# Patient Record
Sex: Female | Born: 1980 | Race: Black or African American | Hispanic: No | Marital: Single | Smoking: Former smoker
Health system: Southern US, Community
[De-identification: ages and names within clinical notes are randomized; demographics above are authoritative.]

## PROBLEM LIST (undated history)

## (undated) DIAGNOSIS — D649 Anemia, unspecified: Secondary | ICD-10-CM

## (undated) DIAGNOSIS — K219 Gastro-esophageal reflux disease without esophagitis: Secondary | ICD-10-CM

## (undated) DIAGNOSIS — E669 Obesity, unspecified: Secondary | ICD-10-CM

## (undated) DIAGNOSIS — IMO0002 Reserved for concepts with insufficient information to code with codable children: Secondary | ICD-10-CM

## (undated) DIAGNOSIS — R87619 Unspecified abnormal cytological findings in specimens from cervix uteri: Secondary | ICD-10-CM

## (undated) HISTORY — DX: Unspecified abnormal cytological findings in specimens from cervix uteri: R87.619

## (undated) HISTORY — DX: Obesity, unspecified: E66.9

## (undated) HISTORY — DX: Reserved for concepts with insufficient information to code with codable children: IMO0002

---

## 2010-04-16 HISTORY — PX: CRYOTHERAPY: SHX1416

## 2011-05-03 ENCOUNTER — Other Ambulatory Visit: Payer: Self-pay

## 2011-05-03 ENCOUNTER — Encounter (HOSPITAL_BASED_OUTPATIENT_CLINIC_OR_DEPARTMENT_OTHER): Payer: Self-pay | Admitting: *Deleted

## 2011-05-03 ENCOUNTER — Emergency Department (INDEPENDENT_AMBULATORY_CARE_PROVIDER_SITE_OTHER): Payer: BC Managed Care – PPO

## 2011-05-03 ENCOUNTER — Emergency Department (HOSPITAL_BASED_OUTPATIENT_CLINIC_OR_DEPARTMENT_OTHER)
Admission: EM | Admit: 2011-05-03 | Discharge: 2011-05-03 | Disposition: A | Discharge status: Home / Self Care | Payer: BC Managed Care – PPO | Attending: Emergency Medicine | Admitting: Emergency Medicine

## 2011-05-03 DIAGNOSIS — R0602 Shortness of breath: Secondary | ICD-10-CM

## 2011-05-03 DIAGNOSIS — K219 Gastro-esophageal reflux disease without esophagitis: Secondary | ICD-10-CM | POA: Insufficient documentation

## 2011-05-03 DIAGNOSIS — M549 Dorsalgia, unspecified: Secondary | ICD-10-CM | POA: Insufficient documentation

## 2011-05-03 DIAGNOSIS — R079 Chest pain, unspecified: Secondary | ICD-10-CM

## 2011-05-03 DIAGNOSIS — F17200 Nicotine dependence, unspecified, uncomplicated: Secondary | ICD-10-CM

## 2011-05-03 HISTORY — DX: Gastro-esophageal reflux disease without esophagitis: K21.9

## 2011-05-03 LAB — URINALYSIS, ROUTINE W REFLEX MICROSCOPIC
Glucose, UA: NEGATIVE mg/dL
Ketones, ur: NEGATIVE mg/dL
Protein, ur: 30 mg/dL — AB
pH: 6.5 (ref 5.0–8.0)

## 2011-05-03 LAB — URINE MICROSCOPIC-ADD ON

## 2011-05-03 LAB — PREGNANCY, URINE: Preg Test, Ur: NEGATIVE

## 2011-05-03 MED ORDER — DIAZEPAM 5 MG PO TABS: 5.0000 mg | ORAL_TABLET | Freq: Every day | ORAL | Status: AC

## 2011-05-03 MED ORDER — IBUPROFEN 800 MG PO TABS: 800.0000 mg | ORAL_TABLET | Freq: Three times a day (TID) | ORAL | Status: AC

## 2011-05-03 MED ORDER — ACETAMINOPHEN 500 MG PO TABS
1000.0000 mg | ORAL_TABLET | Freq: Once | ORAL | Status: AC
  Administered 2011-05-03: 1000 mg via ORAL
  Filled 2011-05-03: qty 2

## 2011-05-03 NOTE — ED Notes (Signed)
Report received from Jodell Cipro, RN and care assumed.  Dr. Jeraldine Loots at bedside.

## 2011-05-03 NOTE — ED Notes (Signed)
C/o left sided back pain since last night, denies any urinary sxs. No known injury

## 2011-05-03 NOTE — ED Provider Notes (Signed)
History     CSN: [id number]  Arrival date & time 05/03/11  1191   First MD Initiated Contact with Patient 05/03/11 [phone]      Chief Complaint  Patient presents with  . Back Pain    (Consider location/radiation/quality/duration/timing/severity/associated sxs/prior treatment) HPI The patient presents with pain focally about her left superior medial scapula. She notes that the pain is a crampy/sharp sensation. Pain is nonradiating, began approximately 12 hours ago, gradually. Since onset the pain has been present constantly, though the patient was able to sleep. No relief with Aleve, pain is worse with positioning; bending over in particular. The patient's work requires standing for "11" hours daily, and she completed a full work day yesterday. No fevers, no chills, minimal nonspecific radiation. No nausea, no vomiting, no exertional or pleuritic pain, no dyspnea. Past Medical History  Diagnosis Date  . GERD (gastroesophageal reflux disease)     History reviewed. No pertinent past surgical history.  History reviewed. No pertinent family history.  History  Substance Use Topics  . Smoking status: Never Smoker   . Smokeless tobacco: Not on file  . Alcohol Use: No    OB History    Grav Para Term Preterm Abortions TAB SAB Ect Mult Living                  Review of Systems  Constitutional:       HPI  HENT:       HPI otherwise negative  Eyes: Negative.   Respiratory:       HPI, otherwise negative  Cardiovascular:       HPI, otherwise nmegative  Gastrointestinal: Negative for vomiting.  Genitourinary:       HPI, otherwise negative  Musculoskeletal:       HPI, otherwise negative  Skin: Negative.   Neurological: Negative for syncope.   LMP: currently menstruating.  Allergies  Review of patient's allergies indicates no known allergies.  Home Medications   Current Outpatient Rx  Name Route Sig Dispense Refill  . RANITIDINE HCL 150 MG PO TABS Oral Take 150 mg by  mouth 2 (two) times daily.      BP 123/70  Pulse 62  Temp(Src) 97.8 F (36.6 C) (Oral)  Resp 17  Ht 5\' 10"  (1.778 m)  Wt 230 lb (104.327 kg)  BMI 33.00 kg/m2  SpO2 100%  LMP 04/30/2011  Physical Exam  Nursing note and vitals reviewed. Constitutional: She is oriented to person, place, and time. She appears well-developed and well-nourished. No distress.  HENT:  Head: Normocephalic and atraumatic.  Eyes: Conjunctivae and EOM are normal.  Cardiovascular: Normal rate and regular rhythm.   Pulmonary/Chest: Effort normal and breath sounds normal. No stridor. No respiratory distress.  Abdominal: She exhibits no distension.  Musculoskeletal: She exhibits no edema.       Arms:      Strength of left shoulder is symmetric to right 5/5.  Neurological: She is alert and oriented to person, place, and time. No cranial nerve deficit.  Skin: Skin is warm and dry.  Psychiatric: She has a normal mood and affect.    ED Course  Procedures (including critical care time)  Labs Reviewed  URINALYSIS, ROUTINE W REFLEX MICROSCOPIC - Abnormal; Notable for the following:    APPearance CLOUDY (*)    Hgb urine dipstick LARGE (*)    Protein, ur 30 (*)    All other components within normal limits  URINE MICROSCOPIC-ADD ON - Abnormal; Notable for the following:  Squamous Epithelial / LPF MANY (*)    Bacteria, UA MANY (*)    All other components within normal limits  PREGNANCY, URINE   No results found.   No diagnosis found.   Date: 05/03/2011  Rate: 56  Rhythm: normal sinus rhythm  QRS Axis: normal  Intervals: normal  ST/T Wave abnormalities: normal  Conduction Disutrbances:none  Narrative Interpretation:   Old EKG Reviewed: none available NORMAL ECG  CXR: no acute findings REVIEWED BY ME   MDM  This generally well young female presents with left superior lateral back pain. On exam she is in no distress, has minimal appreciable pain with palpation, and no neuro deficits. The  patient's ECG E. chest x-ray are both reassuring. Given the absence of risk factors, her youth, with the aforementioned unremarkable findings there is very low suspicion for cardiac etiology, or occult infection. The patient's presentation is most consistent with musculoskeletal discomfort. (The patient is on her menstrual cycle, explaining the blood in her urine. Given the absence of flank pain, dysuria, there is low suspicion for kidney stone. The absence leukocytes or nitrates is reassuring for this not being a manifestation of UTI ). The patient will be discharged to follow up with her primary care physician.        Gerhard Munch, MD 05/03/11 [phone]

## 2011-05-03 NOTE — ED Notes (Addendum)
Charted on wrong pt.

## 2012-08-06 ENCOUNTER — Encounter: Payer: Self-pay | Admitting: Obstetrics & Gynecology

## 2012-08-06 ENCOUNTER — Ambulatory Visit (INDEPENDENT_AMBULATORY_CARE_PROVIDER_SITE_OTHER): Payer: No Typology Code available for payment source | Admitting: Obstetrics & Gynecology

## 2012-08-06 VITALS — BP 123/74 | HR 67 | Temp 98.1°F | Resp 16 | Ht 70.0 in | Wt 249.0 lb

## 2012-08-06 DIAGNOSIS — Z0000 Encounter for general adult medical examination without abnormal findings: Secondary | ICD-10-CM

## 2012-08-06 DIAGNOSIS — Z124 Encounter for screening for malignant neoplasm of cervix: Secondary | ICD-10-CM

## 2012-08-06 DIAGNOSIS — Z01419 Encounter for gynecological examination (general) (routine) without abnormal findings: Secondary | ICD-10-CM

## 2012-08-06 DIAGNOSIS — Z113 Encounter for screening for infections with a predominantly sexual mode of transmission: Secondary | ICD-10-CM

## 2012-08-06 DIAGNOSIS — Z1151 Encounter for screening for human papillomavirus (HPV): Secondary | ICD-10-CM

## 2012-08-06 LAB — CBC
MCH: 24.2 pg — ABNORMAL LOW (ref 26.0–34.0)
MCV: 76.3 fL — ABNORMAL LOW (ref 78.0–100.0)
Platelets: 508 10*3/uL — ABNORMAL HIGH (ref 150–400)
RBC: 4.59 MIL/uL (ref 3.87–5.11)
RDW: 16.2 % — ABNORMAL HIGH (ref 11.5–15.5)
WBC: 4.7 10*3/uL (ref 4.0–10.5)

## 2012-08-06 NOTE — Progress Notes (Signed)
Subjective:    Cathy Walls is a 32 y.o. female who presents for an annual exam. The patient has no complaints today. The patient is sexually active. GYN screening history: last pap: was abnormal: 2012, required cryo, no follow up. The patient wears seatbelts: yes. The patient participates in regular exercise: no. Has the patient ever been transfused or tattooed?: no. The patient reports that there is not domestic violence in her life.   Menstrual History: OB History   Grav Para Term Preterm Abortions TAB SAB Ect Mult Living   3 1 1  1  1   1       Menarche age: 41  Patient's last menstrual period was 07/08/2012.    The following portions of the patient's history were reviewed and updated as appropriate: allergies, current medications, past family history, past medical history, past social history, past surgical history and problem list.  Review of Systems A comprehensive review of systems was negative.  She is a Scientist, clinical (histocompatibility and immunogenetics). She has been monogamous for about a year and denies dyspareunia. She lives with her son. She usses withdrawal for birthcontrol and is considering an IUD.   Objective:    BP 123/74  Pulse 67  Temp(Src) 98.1 F (36.7 C) (Oral)  Resp 16  Ht 5\' 10"  (1.778 m)  Wt 249 lb (112.946 kg)  BMI 35.73 kg/m2  LMP 07/08/2012  General Appearance:    Alert, cooperative, no distress, appears stated age  Head:    Normocephalic, without obvious abnormality, atraumatic  Eyes:    PERRL, conjunctiva/corneas clear, EOM's intact, fundi    benign, both eyes  Ears:    Normal TM's and external ear canals, both ears  Nose:   Nares normal, septum midline, mucosa normal, no drainage    or sinus tenderness  Throat:   Lips, mucosa, and tongue normal; teeth and gums normal  Neck:   Supple, symmetrical, trachea midline, no adenopathy;    thyroid:  no enlargement/tenderness/nodules; no carotid   bruit or JVD  Back:     Symmetric, no curvature, ROM normal, no CVA tenderness  Lungs:      Clear to auscultation bilaterally, respirations unlabored  Chest Wall:    No tenderness or deformity   Heart:    Regular rate and rhythm, S1 and S2 normal, no murmur, rub   or gallop  Breast Exam:    No tenderness, masses, or nipple abnormality  Abdomen:     Soft, non-tender, bowel sounds active all four quadrants,    no masses, no organomegaly, obese  Genitalia:    Normal female without lesion, discharge or tenderness, thin white discharge, NSSA, NT, normal adnexal exam, acanthosis nigricans noted in groin     Extremities:   Extremities normal, atraumatic, no cyanosis or edema  Pulses:   2+ and symmetric all extremities  Skin:   Skin color, texture, turgor normal, no rashes or lesions  Lymph nodes:   Cervical, supraclavicular, and axillary nodes normal  Neurologic:   CNII-XII intact, normal strength, sensation and reflexes    throughout  .    Assessment:    Healthy female exam.  Vaginal discharge   Plan:     Thin prep Pap smear.  with HPV cotesting and cultures Screening fasting labs today Information given about IUDs.

## 2012-08-07 ENCOUNTER — Encounter: Payer: Self-pay | Admitting: Obstetrics & Gynecology

## 2012-08-07 ENCOUNTER — Telehealth: Payer: Self-pay | Admitting: *Deleted

## 2012-08-07 LAB — COMPREHENSIVE METABOLIC PANEL
ALT: 12 U/L (ref 0–35)
AST: 17 U/L (ref 0–37)
CO2: 29 mEq/L (ref 19–32)
Creat: 0.81 mg/dL (ref 0.50–1.10)
Sodium: 140 mEq/L (ref 135–145)
Total Bilirubin: 0.4 mg/dL (ref 0.3–1.2)
Total Protein: 7.2 g/dL (ref 6.0–8.3)

## 2012-08-07 LAB — LIPID PANEL
HDL: 42 mg/dL (ref >39)
LDL Cholesterol: 109 mg/dL — ABNORMAL HIGH (ref 0–99)
Total CHOL/HDL Ratio: 3.9 Ratio
VLDL: 14 mg/dL (ref 0–40)

## 2012-08-07 LAB — TSH: TSH: 1.941 u[IU]/mL (ref 0.350–4.500)

## 2012-08-07 NOTE — Telephone Encounter (Signed)
Copy of labs mailed to pt's home address. 

## 2012-08-11 ENCOUNTER — Telehealth: Payer: Self-pay | Admitting: *Deleted

## 2012-08-11 NOTE — Telephone Encounter (Signed)
Pt notified of lab results from 08/07/12

## 2012-08-12 ENCOUNTER — Telehealth: Payer: Self-pay | Admitting: *Deleted

## 2012-08-12 DIAGNOSIS — B9689 Other specified bacterial agents as the cause of diseases classified elsewhere: Secondary | ICD-10-CM

## 2012-08-12 MED ORDER — METRONIDAZOLE 500 MG PO TABS: 500.0000 mg | ORAL_TABLET | Freq: Two times a day (BID) | ORAL | Status: DC

## 2012-08-12 NOTE — Telephone Encounter (Signed)
Message copied by Granville Lewis on Tue Aug 12, 2012 11:53 AM ------      Message from: Allie Bossier      Created: Tue Aug 12, 2012 11:48 AM       She has BV and will need flagyl 500 mg TID for 5 days. No alcohol.      Thanks ------

## 2012-08-12 NOTE — Telephone Encounter (Signed)
Sent to [facility] in [place].  Pt is scheduled for Mirena insert tomorrow.

## 2012-08-13 ENCOUNTER — Ambulatory Visit: Payer: No Typology Code available for payment source | Admitting: Advanced Practice Midwife

## 2012-08-13 ENCOUNTER — Telehealth: Payer: Self-pay | Admitting: *Deleted

## 2012-08-13 NOTE — Telephone Encounter (Signed)
Pt notified of positive BV and RX has been sent to her pharmacy for Flagyl.  Her Mirena appt has been cancelled until after she finishes the Flagyl due to increased risk of PID.  She will call to reschedule appt.

## 2012-09-04 ENCOUNTER — Encounter: Payer: Self-pay | Admitting: Obstetrics & Gynecology

## 2012-09-04 ENCOUNTER — Ambulatory Visit: Payer: No Typology Code available for payment source | Admitting: Obstetrics & Gynecology

## 2012-09-04 ENCOUNTER — Ambulatory Visit (INDEPENDENT_AMBULATORY_CARE_PROVIDER_SITE_OTHER): Payer: No Typology Code available for payment source | Admitting: Obstetrics & Gynecology

## 2012-09-04 VITALS — BP 117/79 | HR 88 | Resp 16 | Ht 69.0 in | Wt 251.0 lb

## 2012-09-04 DIAGNOSIS — B9689 Other specified bacterial agents as the cause of diseases classified elsewhere: Secondary | ICD-10-CM

## 2012-09-04 DIAGNOSIS — A499 Bacterial infection, unspecified: Secondary | ICD-10-CM

## 2012-09-04 DIAGNOSIS — N760 Acute vaginitis: Secondary | ICD-10-CM

## 2012-09-04 MED ORDER — CLINDAMYCIN PHOSPHATE 1 % EX GEL: Freq: Two times a day (BID) | CUTANEOUS | Status: DC

## 2012-09-04 NOTE — Progress Notes (Signed)
  Subjective:    Patient ID: Cathy Walls, female    DOB: [birth year], 32 y.o.   MRN: [id number]  HPI  Cathy Walls is here because her BV is still present. She took 7 days of BID flagyl and still has symptoms.  Review of Systems     Objective:   Physical Exam  Same white frothy discharge that smells and looks like BV      Assessment & Plan:   BV- cleocin cream

## 2012-09-05 ENCOUNTER — Other Ambulatory Visit: Payer: Self-pay | Admitting: Obstetrics and Gynecology

## 2012-09-30 ENCOUNTER — Ambulatory Visit (INDEPENDENT_AMBULATORY_CARE_PROVIDER_SITE_OTHER): Payer: No Typology Code available for payment source | Admitting: Obstetrics & Gynecology

## 2012-09-30 ENCOUNTER — Encounter: Payer: Self-pay | Admitting: Obstetrics & Gynecology

## 2012-09-30 VITALS — BP 116/68 | HR 84 | Resp 16 | Ht 69.0 in | Wt 250.0 lb

## 2012-09-30 DIAGNOSIS — N949 Unspecified condition associated with female genital organs and menstrual cycle: Secondary | ICD-10-CM

## 2012-09-30 DIAGNOSIS — N898 Other specified noninflammatory disorders of vagina: Secondary | ICD-10-CM | POA: Insufficient documentation

## 2012-09-30 DIAGNOSIS — R300 Dysuria: Secondary | ICD-10-CM

## 2012-09-30 DIAGNOSIS — N9489 Other specified conditions associated with female genital organs and menstrual cycle: Secondary | ICD-10-CM

## 2012-09-30 LAB — POCT URINALYSIS DIPSTICK
Leukocytes, UA: NEGATIVE
Nitrite, UA: NEGATIVE
Protein, UA: 300
Urobilinogen, UA: 0.2
pH, UA: 6.5

## 2012-09-30 NOTE — Progress Notes (Signed)
  Subjective:    Patient ID: Cathy Walls, female    DOB: [birth year], 32 y.o.   MRN: [id number]  HPI  Pt diagnosed with BV in 4/14 (postive PCR).  Pt returned in 5/14 with more symptoms and clinical exam c/w BV.  Pt still has symptoms and burning with urination.  Pt c/o bump on left labia majora.  Denies new sex partner or pelvic pain.  No abnml bleeding.  Pt used Monistat recently w/o relief.  NO fishy odor.  Review of Systems    see HPI Objective:   Physical Exam  Vitals reviewed. Constitutional: She appears well-developed and well-nourished. No distress.  HENT:  Head: Normocephalic and atraumatic.  Eyes: Conjunctivae are normal.  Pulmonary/Chest: Effort normal.  Abdominal: Soft. She exhibits no distension. There is no tenderness.  Genitourinary: Vagina normal and uterus normal. No vaginal discharge found.  Skin: Skin is warm and dry.  Psychiatric: She has a normal mood and affect.          Assessment & Plan:  32 yo female with dysuria and vaginal d/c  1-UA, U cx 2-BD affirm and cultures. 3-No lesions on vulva 4-Will call with results.

## 2012-09-30 NOTE — Addendum Note (Signed)
Addended by: Arne Cleveland on: 09/30/2012 03:34 PM   Modules accepted: Orders

## 2012-10-01 ENCOUNTER — Telehealth: Payer: Self-pay | Admitting: *Deleted

## 2012-10-01 DIAGNOSIS — A599 Trichomoniasis, unspecified: Secondary | ICD-10-CM

## 2012-10-01 LAB — GC/CHLAMYDIA PROBE AMP
CT Probe RNA: NEGATIVE
GC Probe RNA: NEGATIVE

## 2012-10-01 LAB — WET PREP BY MOLECULAR PROBE: Candida species: NEGATIVE

## 2012-10-01 MED ORDER — METRONIDAZOLE 500 MG PO TABS: 500.0000 mg | ORAL_TABLET | Freq: Two times a day (BID) | ORAL | Status: DC

## 2012-10-01 NOTE — Telephone Encounter (Signed)
Pt notified of positive Trichomonas.  RX for Flagyl sent to [facility].  Pt aware that her partner needs RX also and should not have intercourse until both are treated.  Pt voices understanding.

## 2012-10-02 ENCOUNTER — Telehealth: Payer: Self-pay | Admitting: *Deleted

## 2012-10-02 DIAGNOSIS — A599 Trichomoniasis, unspecified: Secondary | ICD-10-CM

## 2012-10-02 MED ORDER — METRONIDAZOLE 500 MG PO TABS: ORAL_TABLET | ORAL | Status: DC

## 2012-10-02 NOTE — Telephone Encounter (Signed)
Pharmacy notified to change RX of Flagyl to 2 GM to be taken at once.  Pt aware.

## 2012-10-04 LAB — CULTURE, URINE COMPREHENSIVE

## 2012-10-08 ENCOUNTER — Encounter: Payer: Self-pay | Admitting: Obstetrics & Gynecology

## 2012-10-08 ENCOUNTER — Ambulatory Visit (INDEPENDENT_AMBULATORY_CARE_PROVIDER_SITE_OTHER): Payer: No Typology Code available for payment source | Admitting: Obstetrics & Gynecology

## 2012-10-08 VITALS — BP 130/85 | HR 93 | Resp 16 | Ht 69.0 in | Wt 251.0 lb

## 2012-10-08 DIAGNOSIS — N9489 Other specified conditions associated with female genital organs and menstrual cycle: Secondary | ICD-10-CM

## 2012-10-08 DIAGNOSIS — N949 Unspecified condition associated with female genital organs and menstrual cycle: Secondary | ICD-10-CM

## 2012-10-08 NOTE — Progress Notes (Signed)
  Subjective:    Patient ID: Cathy Walls, female    DOB: Sep 02, 1980, 32 y.o.   MRN: [id number]  HPI  Rahaf is a 32 yo S AA lady who was diagnosed with trich via BD Affirm. She took her flagyl 2 grams po 6 days ago and still feels the same symptoms. She reports that her partner was treated and that she has not had any sex since her diagnosis. She declines testing for bloodborn STIs.  Review of Systems     Objective:   Physical Exam  Thick which vaginal discharge, not curdy, not smelly, not frothy      Assessment & Plan:   I will retest with the BD Affirm today per her requet. RTC 6 months for annual/prn sooner

## 2012-10-09 ENCOUNTER — Telehealth: Payer: Self-pay | Admitting: *Deleted

## 2012-10-09 LAB — WET PREP BY MOLECULAR PROBE
Candida species: NEGATIVE
Gardnerella vaginalis: NEGATIVE
Trichomonas vaginosis: NEGATIVE

## 2012-10-09 NOTE — Telephone Encounter (Signed)
LM on voicemail that her affirm was negative. 

## 2012-10-14 ENCOUNTER — Ambulatory Visit (INDEPENDENT_AMBULATORY_CARE_PROVIDER_SITE_OTHER): Payer: No Typology Code available for payment source | Admitting: Obstetrics & Gynecology

## 2012-10-14 ENCOUNTER — Encounter: Payer: Self-pay | Admitting: Obstetrics & Gynecology

## 2012-10-14 VITALS — BP 98/70 | HR 84 | Temp 98.6°F | Resp 16 | Ht 69.0 in | Wt 249.0 lb

## 2012-10-14 DIAGNOSIS — N390 Urinary tract infection, site not specified: Secondary | ICD-10-CM

## 2012-10-14 DIAGNOSIS — R350 Frequency of micturition: Secondary | ICD-10-CM

## 2012-10-14 LAB — POCT URINALYSIS DIPSTICK
Bilirubin, UA: NEGATIVE
Nitrite, UA: NEGATIVE
Protein, UA: 1
pH, UA: 6.5

## 2012-10-14 MED ORDER — CIPROFLOXACIN HCL 500 MG PO TABS: 500.0000 mg | ORAL_TABLET | Freq: Two times a day (BID) | ORAL | Status: DC

## 2012-10-14 NOTE — Progress Notes (Signed)
GYNECOLOGY CLINIC PROGRESS NOTE  History:  32 y.o. [id number] here today for increased urinary frequency for several days.  Denies pain, fevers, hematuria or other symptoms.  She was seen on 09/30/12 for same compliant, UA was negative, and culture grew out 5K colonies of pansensitive Staphylococci. No GYN concerns.  The following portions of the patient's history were reviewed and updated as appropriate: allergies, current medications, past family history, past medical history, past social history, past surgical history and problem list.  Negative pap and HRHPV on 08/06/12.  Review of Systems:  Pertinent items are noted in HPI.  Objective:  BP 98/70  Pulse 84  Temp(Src) 98.6 F (37 C) (Oral)  Resp 16  Ht 5\' 9"  (1.753 m)  Wt 249 lb (112.946 kg)  BMI 36.75 kg/m2  LMP 09/22/2012 Physical Exam deferred  Labs and Imaging Results for orders placed in visit on 10/14/12 (from the past 24 hour(s))  POCT URINALYSIS DIPSTICK     Status: Abnormal   Collection Time    10/14/12  3:20 PM      Result Value Range   Color, UA yellow     Clarity, UA cloudy     Glucose, UA neg     Bilirubin, UA neg     Ketones, UA neg     Spec Grav, UA 1.020     Blood, UA large     pH, UA 6.5     Protein, UA 1 t     Urobilinogen, UA negative     Nitrite, UA neg     Leukocytes, UA Negative     Urine culture sent.  Assessment & Plan:  Given persistent symptoms, will treat with Ciprofloxacin Will follow up urine cultures and ensure patient is adequately treated Routine preventative health maintenance measures emphasized

## 2012-10-14 NOTE — Patient Instructions (Signed)
Urinary Tract Infection  Urinary tract infections (UTIs) can develop anywhere along your urinary tract. Your urinary tract is your body's drainage system for removing wastes and extra water. Your urinary tract includes two kidneys, two ureters, a bladder, and a urethra. Your kidneys are a pair of bean-shaped organs. Each kidney is about the size of your fist. They are located below your ribs, one on each side of your spine.  CAUSES  Infections are caused by microbes, which are microscopic organisms, including fungi, viruses, and bacteria. These organisms are so small that they can only be seen through a microscope. Bacteria are the microbes that most commonly cause UTIs.  SYMPTOMS   Symptoms of UTIs may vary by age and gender of the patient and by the location of the infection. Symptoms in young women typically include a frequent and intense urge to urinate and a painful, burning feeling in the bladder or urethra during urination. Older women and men are more likely to be tired, shaky, and weak and have muscle aches and abdominal pain. A fever may mean the infection is in your kidneys. Other symptoms of a kidney infection include pain in your back or sides below the ribs, nausea, and vomiting.  DIAGNOSIS  To diagnose a UTI, your caregiver will ask you about your symptoms. Your caregiver also will ask to provide a urine sample. The urine sample will be tested for bacteria and white blood cells. White blood cells are made by your body to help fight infection.  TREATMENT   Typically, UTIs can be treated with medication. Because most UTIs are caused by a bacterial infection, they usually can be treated with the use of antibiotics. The choice of antibiotic and length of treatment depend on your symptoms and the type of bacteria causing your infection.  HOME CARE INSTRUCTIONS   If you were prescribed antibiotics, take them exactly as your caregiver instructs you. Finish the medication even if you feel better after you  have only taken some of the medication.   Drink enough water and fluids to keep your urine clear or pale yellow.   Avoid caffeine, tea, and carbonated beverages. They tend to irritate your bladder.   Empty your bladder often. Avoid holding urine for long periods of time.   Empty your bladder before and after sexual intercourse.   After a bowel movement, women should cleanse from front to back. Use each tissue only once.  SEEK MEDICAL CARE IF:    You have back pain.   You develop a fever.   Your symptoms do not begin to resolve within 3 days.  SEEK IMMEDIATE MEDICAL CARE IF:    You have severe back pain or lower abdominal pain.   You develop chills.   You have nausea or vomiting.   You have continued burning or discomfort with urination.  MAKE SURE YOU:    Understand these instructions.   Will watch your condition.   Will get help right away if you are not doing well or get worse.  Document Released: 01/10/2005 Document Revised: 10/02/2011 Document Reviewed: 05/11/2011  ExitCare Patient Information 2014 [employer].

## 2012-12-23 ENCOUNTER — Ambulatory Visit (INDEPENDENT_AMBULATORY_CARE_PROVIDER_SITE_OTHER): Payer: No Typology Code available for payment source | Admitting: Obstetrics & Gynecology

## 2012-12-23 ENCOUNTER — Encounter: Payer: Self-pay | Admitting: Obstetrics & Gynecology

## 2012-12-23 VITALS — BP 129/87 | HR 79 | Ht 69.0 in | Wt 252.0 lb

## 2012-12-23 DIAGNOSIS — R319 Hematuria, unspecified: Secondary | ICD-10-CM

## 2012-12-23 DIAGNOSIS — N912 Amenorrhea, unspecified: Secondary | ICD-10-CM

## 2012-12-23 DIAGNOSIS — N390 Urinary tract infection, site not specified: Secondary | ICD-10-CM | POA: Insufficient documentation

## 2012-12-23 LAB — POCT URINALYSIS DIPSTICK
Bilirubin, UA: NEGATIVE
Ketones, UA: NEGATIVE
Spec Grav, UA: >=1.03
pH, UA: 8

## 2012-12-23 LAB — POCT URINE PREGNANCY: Preg Test, Ur: NEGATIVE

## 2012-12-23 MED ORDER — SULFAMETHOXAZOLE-TRIMETHOPRIM 800-160 MG PO TABS: 1.0000 | ORAL_TABLET | Freq: Two times a day (BID) | ORAL | Status: DC

## 2012-12-23 NOTE — Progress Notes (Signed)
Pt presents for urinary frequency and some vaginal d/c.  No back pain or fevers.  Pt did have 2 menses this last month (UPT is negative today).  Pt uses condoms for birth control and to protect from STDs.  She is no longer with the partner that gave her trich (she has negative TOC).   UA shows large blood and sm leuks Pt had UTI 2-3 months ago.   Has been practicing good hygiene and voiding before and after sex  Pt does not want to be tested for STDs  Treat with bactrim Send Culture  RTC if vaginal d/c does not improve.

## 2012-12-25 LAB — CULTURE, URINE COMPREHENSIVE: Colony Count: NO GROWTH

## 2013-02-19 ENCOUNTER — Other Ambulatory Visit: Payer: Self-pay

## 2013-12-09 ENCOUNTER — Ambulatory Visit: Payer: No Typology Code available for payment source | Admitting: Obstetrics & Gynecology

## 2013-12-09 DIAGNOSIS — Z01419 Encounter for gynecological examination (general) (routine) without abnormal findings: Secondary | ICD-10-CM

## 2014-01-21 ENCOUNTER — Other Ambulatory Visit: Payer: Self-pay | Admitting: Obstetrics and Gynecology

## 2014-01-21 ENCOUNTER — Other Ambulatory Visit (HOSPITAL_COMMUNITY)
Admission: RE | Admit: 2014-01-21 | Discharge: 2014-01-21 | Disposition: A | Discharge status: Home / Self Care | Payer: BC Managed Care – PPO | Source: Ambulatory Visit | Attending: Obstetrics and Gynecology | Admitting: Obstetrics and Gynecology

## 2014-01-21 DIAGNOSIS — Z01419 Encounter for gynecological examination (general) (routine) without abnormal findings: Secondary | ICD-10-CM | POA: Insufficient documentation

## 2014-01-21 DIAGNOSIS — Z1151 Encounter for screening for human papillomavirus (HPV): Secondary | ICD-10-CM | POA: Diagnosis present

## 2014-01-22 LAB — CYTOLOGY - PAP

## 2014-02-15 ENCOUNTER — Encounter: Payer: Self-pay | Admitting: Obstetrics & Gynecology

## 2016-11-07 ENCOUNTER — Ambulatory Visit (INDEPENDENT_AMBULATORY_CARE_PROVIDER_SITE_OTHER): Payer: BLUE CROSS/BLUE SHIELD | Admitting: Obstetrics & Gynecology

## 2016-11-07 ENCOUNTER — Encounter: Payer: Self-pay | Admitting: *Deleted

## 2016-11-07 VITALS — BP 124/71 | HR 70 | Wt 255.0 lb

## 2016-11-07 DIAGNOSIS — D251 Intramural leiomyoma of uterus: Secondary | ICD-10-CM

## 2016-11-07 DIAGNOSIS — D250 Submucous leiomyoma of uterus: Secondary | ICD-10-CM

## 2016-11-07 DIAGNOSIS — N939 Abnormal uterine and vaginal bleeding, unspecified: Secondary | ICD-10-CM

## 2016-11-07 DIAGNOSIS — D252 Subserosal leiomyoma of uterus: Secondary | ICD-10-CM | POA: Diagnosis not present

## 2016-11-07 MED ORDER — NORGESTIMATE-ETH ESTRADIOL 0.25-35 MG-MCG PO TABS: 1.0000 | ORAL_TABLET | Freq: Every day | ORAL | 4 refills | Status: DC

## 2016-11-07 NOTE — Progress Notes (Signed)
LAST PAP 01/21/2014

## 2016-11-07 NOTE — Progress Notes (Addendum)
History:  36 y.o. G3P1011 here today for eval of AUB and uterine fibroids.  Pt got Depo Provera for bleeding and now the bleeding is worse and assoc with blood clots. Pt was seeing Dr. Simona Huh for GYN care but,had ins issues and went to the HD for the Depo Provera. Pt prev was told that she had fibroids. She had [place] and was told that they were not growing. She did have a cervical biopsy years ago. Her Last 2 PAPs were WNL. Pt wishes to maintain her fertility. Pt is not trying ot get pregnant now.  Pt now just want to stop bleeding.   Pt started OCPs Mon. 2 days prev. These were Rx'd at the HD.     The following portions of the patient's history were reviewed and updated as appropriate: allergies, current medications, past family history, past medical history, past social history, past surgical history and problem list.  Review of Systems:  Pertinent items are noted in HPI.   Objective:  Physical Exam Blood pressure 124/71, pulse 70, weight 255 lb (115.7 kg).  CONSTITUTIONAL: Well-developed, well-nourished female in no acute distress.  HENT:  Normocephalic, atraumatic EYES: Conjunctivae and EOM are normal. No scleral icterus.  NECK: Normal range of motion SKIN: Skin is warm and dry. No rash noted. Not diaphoretic.No pallor. [place]: Alert and oriented to person, place, and time. Normal coordination.   Abd: Soft, nontender and nondistended Pelvic: Normal appearing external genitalia; Large amount of blood with clots in the vagina. Enlarged uterus- fibroids palpable. Uterus- 10 -12 weeks sized, no other palpable masses, no  adnexal tenderness   Assessment & Plan:  AUB due to fibroids.   Records from [facility] Take OCPs Sprintec 2 po bid x 2 days then 1 po bid x 5 days then 1 x/ day  F/u in 6 weeks or sooner prn Total face-to-face time with patient was 20 min.  Greater than 50% was spent in counseling and coordination of care with the patient.   Sham Alviar L. Harraway-Smith, M.D.,  [place]  11/12/2016 Records received form [facility] [place] 05/15/2106 Fundal fibroid: 4.9 x 3.4 x 4.1 (subserosal) (prev 3.3 x 2.6. 2.2) Endometrium thickened  Left message for pt to call me if her bleeding had not improved.  Yanky Vanderburg L. Harraway-Smith, M.D., [place]

## 2016-11-07 NOTE — Patient Instructions (Signed)
Uterine Fibroids Uterine fibroids are tissue masses (tumors) that can develop in the womb (uterus). They are also called leiomyomas. This type of tumor is not cancerous (benign) and does not spread to other parts of the body outside of the pelvic area, which is between the hip bones. Occasionally, fibroids may develop in the fallopian tubes, in the cervix, or on the support structures (ligaments) that surround the uterus. You can have one or many fibroids. Fibroids can vary in size, weight, and where they grow in the uterus. Some can become quite large. Most fibroids do not require medical treatment. What are the causes? A fibroid can develop when a single uterine cell keeps growing (replicating). Most cells in the human body have a control mechanism that keeps them from replicating without control. What are the signs or symptoms? Symptoms may include:  Heavy bleeding during your period.  Bleeding or spotting between periods.  Pelvic pain and pressure.  Bladder problems, such as needing to urinate more often (urinary frequency) or urgently.  Inability to reproduce offspring (infertility).  Miscarriages.  How is this diagnosed? Uterine fibroids are diagnosed through a physical exam. Your health care provider may feel the lumpy tumors during a pelvic exam. Ultrasonography and an MRI may be done to determine the size, location, and number of fibroids. How is this treated? Treatment may include:  Watchful waiting. This involves getting the fibroid checked by your health care provider to see if it grows or shrinks. Follow your health care provider's recommendations for how often to have this checked.  Hormone medicines. These can be taken by mouth or given through an intrauterine device (IUD).  Surgery. ? Removing the fibroids (myomectomy) or the uterus (hysterectomy). ? Removing blood supply to the fibroids (uterine artery embolization).  If fibroids interfere with your fertility and you  want to become pregnant, your health care provider may recommend having the fibroids removed. Follow these instructions at home:  Keep all follow-up visits as directed by your health care provider. This is important.  Take over-the-counter and prescription medicines only as told by your health care provider. ? If you were prescribed a hormone treatment, take the hormone medicines exactly as directed.  Ask your health care provider about taking iron pills and increasing the amount of dark green, leafy vegetables in your diet. These actions can help to boost your blood iron levels, which may be affected by heavy menstrual bleeding.  Pay close attention to your period and tell your health care provider about any changes, such as: ? Increased blood flow that requires you to use more pads or tampons than usual per month. ? A change in the number of days that your period lasts per month. ? A change in symptoms that are associated with your period, such as abdominal cramping or back pain. Contact a health care provider if:  You have pelvic pain, back pain, or abdominal cramps that cannot be controlled with medicines.  You have an increase in bleeding between and during periods.  You soak tampons or pads in a half hour or less.  You feel lightheaded, extra tired, or weak. Get help right away if:  You faint.  You have a sudden increase in pelvic pain. This information is not intended to replace advice given to you by your health care provider. Make sure you discuss any questions you have with your health care provider. Document Released: 03/30/2000 Document Revised: 12/01/2015 Document Reviewed: 09/29/2013 Elsevier Interactive Patient Education  2018 Elsevier Inc.  

## 2016-11-08 ENCOUNTER — Telehealth: Payer: Self-pay | Admitting: Obstetrics & Gynecology

## 2016-11-08 DIAGNOSIS — N939 Abnormal uterine and vaginal bleeding, unspecified: Secondary | ICD-10-CM

## 2016-11-08 LAB — CBC
Hematocrit: 31.9 % — ABNORMAL LOW (ref 34.0–46.6)
Hemoglobin: 10.2 g/dL — ABNORMAL LOW (ref 11.1–15.9)
MCH: 25.8 pg — ABNORMAL LOW (ref 26.6–33.0)
MCHC: 32 g/dL (ref 31.5–35.7)
MCV: 81 fL (ref 79–97)
PLATELETS: 426 10*3/uL — AB (ref 150–379)
RBC: 3.95 x10E6/uL (ref 3.77–5.28)
RDW: 15.1 % (ref 12.3–15.4)
WBC: 6.7 10*3/uL (ref 3.4–10.8)

## 2016-11-08 LAB — TSH: TSH: 2.99 u[IU]/mL (ref 0.450–4.500)

## 2016-11-08 NOTE — Telephone Encounter (Signed)
Calling regarding a Rx that Dr.Dove wrote for her.

## 2016-11-12 ENCOUNTER — Telehealth: Payer: Self-pay | Admitting: Obstetrics & Gynecology

## 2016-11-12 MED ORDER — NORGESTIMATE-ETH ESTRADIOL 0.25-35 MG-MCG PO TABS: 1.0000 | ORAL_TABLET | Freq: Every day | ORAL | 11 refills | Status: DC

## 2016-11-12 NOTE — Telephone Encounter (Signed)
TC to pt.  I received her records from [facility]. I left a VM for her to call back if she cont'd to bleed or f/u in 6 weeks as prev dicussed.   Qiara Minetti L. Harraway-Smith, M.D., [place]

## 2016-11-12 NOTE — Telephone Encounter (Signed)
Patient  Left message, was prescribed medication but directions were incorrect and insurance will not pay. Please return call.

## 2016-11-13 NOTE — Telephone Encounter (Signed)
Issue was addressed yesterday by Estill Bamberg Rash LPN, new prescription sent.

## 2016-11-22 ENCOUNTER — Other Ambulatory Visit: Payer: Self-pay

## 2016-11-22 ENCOUNTER — Telehealth: Payer: Self-pay | Admitting: Obstetrics & Gynecology

## 2016-11-22 MED ORDER — MEGESTROL ACETATE 40 MG PO TABS: 40.0000 mg | ORAL_TABLET | Freq: Two times a day (BID) | ORAL | 2 refills | Status: DC

## 2016-11-22 NOTE — Telephone Encounter (Signed)
Patient called stating she is having some bleeding going through 1 pad every 3 hours with cramping. Per Dr.Harraway-Smith patient should contact office if bleeding starts. Medications has been called into patient pharmacy to help with bleeding. Patient has been advised on how to take medications.

## 2016-11-22 NOTE — Telephone Encounter (Signed)
Called patient she is soaking up 1 every 2-3 hour with some abdominal pain. Per Dr.Harraway patient should start on megace to help with the bleeding. Medications has been called into her pharmacy of choice.

## 2016-11-22 NOTE — Telephone Encounter (Signed)
Was seen 2 weeks ago, was told to call back if she started bleeding again and she would give her something else

## 2016-11-27 ENCOUNTER — Telehealth: Payer: Self-pay | Admitting: Obstetrics & Gynecology

## 2016-11-27 NOTE — Telephone Encounter (Signed)
Patient stated she called on Monday 08/13, and left a message for Dr. Ihor Dow to call her about her bleeding, and she has not received a call from anybody.

## 2016-11-27 NOTE — Telephone Encounter (Signed)
Called patient she reports still bleeding. She is taking the megace twice a day and birth control pills. Please advise

## 2016-11-27 NOTE — Telephone Encounter (Signed)
Heard message left by pt yesterday on nurse voicemail @ [phone]. She stated that she is still having issues with bleeding. She is still taking the birth control pills daily and has been taking the Megace as prescribed last week. There has been only a slight change in the bleeding. I called pt and advised that I will send message to the doctor and call her back once a response has been received. Pt voiced understanding and said that a detailed message can be left on her voicemail.  Message sent to Dr. Ihor Dow.

## 2016-11-29 ENCOUNTER — Telehealth: Payer: Self-pay | Admitting: Obstetrics & Gynecology

## 2016-11-29 ENCOUNTER — Other Ambulatory Visit: Payer: Self-pay | Admitting: Obstetrics & Gynecology

## 2016-11-29 DIAGNOSIS — N939 Abnormal uterine and vaginal bleeding, unspecified: Secondary | ICD-10-CM

## 2016-11-29 MED ORDER — TRANEXAMIC ACID 650 MG PO TABS: 1300.0000 mg | ORAL_TABLET | Freq: Three times a day (TID) | ORAL | 1 refills | Status: DC

## 2016-11-29 NOTE — Telephone Encounter (Signed)
Returned TC to pt. She reports that she is still bleeding heavy. She is using the incontinecne pads.  Pt had her last Depo Provera July 19th for bleeding but, the bleeding has been worse.   Her sx have not improved with Megace or OCPs. Her last US done at [facility] showed a thickened endometrium  Patient desires surgical management with hysteroscopy with D&C.  The risks of surgery were discussed in detail with the patient including but not limited to: bleeding which may require transfusion or reoperation; infection which may require prolonged hospitalization or re-hospitalization and antibiotic therapy; injury to bowel, bladder, ureters and major vessels or other surrounding organs; need for additional procedures including laparotomy; thromboembolic phenomenon, incisional problems and other postoperative or anesthesia complications.  Patient was told that the likelihood that her condition and symptoms will be treated effectively with this surgical management was very high; the postoperative expectations were also discussed in detail. The patient also understands the alternative treatment options which were discussed in full. All questions were answered.  She was told that she will be contacted by our surgical scheduler regarding the time and date of her surgery.  TXA 1300mg  tid x 5 days with RF x1 D/C megace D/C OCPs  Cathy Walls, M.D., [facility]

## 2016-12-03 ENCOUNTER — Encounter (HOSPITAL_COMMUNITY): Payer: Self-pay

## 2016-12-04 NOTE — Telephone Encounter (Signed)
Called patient and she states she has been taking the lysteda as prescribed but it was very expensive when she purchased it. Patient states she is still bleeding. Patient also asked about how long she would be out of work after surgery. Told patient I will send a message to Dr Ihor Dow with her concerns and she will call her back or we will call her back. Told patient it may be a few days as Dr Ihor Dow is currently out of the office. Patient verbalized understanding & had no questions

## 2016-12-05 NOTE — Telephone Encounter (Signed)
Called patient and told her she will be able to return back to work the next day. Also discussed that if her bleeding is light she may wait until surgery but if it is more than that she can restart the megace at 40mg  BID or TID. Patient verbalized understanding and states she is going to try the lysteda a little while longer and will call us if something changes. Patient had no questions

## 2016-12-10 ENCOUNTER — Encounter (HOSPITAL_COMMUNITY): Payer: Self-pay | Admitting: *Deleted

## 2016-12-31 ENCOUNTER — Ambulatory Visit (HOSPITAL_COMMUNITY): Payer: BLUE CROSS/BLUE SHIELD | Admitting: Certified Registered Nurse Anesthetist

## 2016-12-31 ENCOUNTER — Encounter (HOSPITAL_COMMUNITY)
Admission: RE | Disposition: A | Discharge status: Home / Self Care | Payer: Self-pay | Source: Ambulatory Visit | Attending: Obstetrics & Gynecology

## 2016-12-31 ENCOUNTER — Encounter (HOSPITAL_COMMUNITY): Payer: Self-pay | Admitting: Emergency Medicine

## 2016-12-31 ENCOUNTER — Ambulatory Visit (HOSPITAL_COMMUNITY)
Admission: RE | Admit: 2016-12-31 | Discharge: 2016-12-31 | Disposition: A | Discharge status: Home / Self Care | Payer: BLUE CROSS/BLUE SHIELD | Source: Ambulatory Visit | Attending: Obstetrics & Gynecology | Admitting: Obstetrics & Gynecology

## 2016-12-31 DIAGNOSIS — K219 Gastro-esophageal reflux disease without esophagitis: Secondary | ICD-10-CM | POA: Insufficient documentation

## 2016-12-31 DIAGNOSIS — N939 Abnormal uterine and vaginal bleeding, unspecified: Secondary | ICD-10-CM | POA: Diagnosis not present

## 2016-12-31 DIAGNOSIS — N840 Polyp of corpus uteri: Secondary | ICD-10-CM | POA: Diagnosis not present

## 2016-12-31 DIAGNOSIS — D259 Leiomyoma of uterus, unspecified: Secondary | ICD-10-CM | POA: Diagnosis not present

## 2016-12-31 DIAGNOSIS — R7303 Prediabetes: Secondary | ICD-10-CM | POA: Diagnosis not present

## 2016-12-31 DIAGNOSIS — Z87891 Personal history of nicotine dependence: Secondary | ICD-10-CM | POA: Insufficient documentation

## 2016-12-31 DIAGNOSIS — Z6836 Body mass index (BMI) 36.0-36.9, adult: Secondary | ICD-10-CM | POA: Diagnosis not present

## 2016-12-31 HISTORY — PX: DILATATION & CURETTAGE/HYSTEROSCOPY WITH MYOSURE: SHX6511

## 2016-12-31 HISTORY — DX: Anemia, unspecified: D64.9

## 2016-12-31 LAB — CBC
HEMATOCRIT: 35.3 % — AB (ref 36.0–46.0)
HEMOGLOBIN: 11.2 g/dL — AB (ref 12.0–15.0)
MCH: 24.9 pg — AB (ref 26.0–34.0)
MCHC: 31.7 g/dL (ref 30.0–36.0)
MCV: 78.4 fL (ref 78.0–100.0)
Platelets: 452 10*3/uL — ABNORMAL HIGH (ref 150–400)
RBC: 4.5 MIL/uL (ref 3.87–5.11)
RDW: 13.8 % (ref 11.5–15.5)
WBC: 6.3 10*3/uL (ref 4.0–10.5)

## 2016-12-31 LAB — PREGNANCY, URINE: Preg Test, Ur: NEGATIVE

## 2016-12-31 SURGERY — DILATATION & CURETTAGE/HYSTEROSCOPY WITH MYOSURE
Anesthesia: General | Site: Vagina

## 2016-12-31 MED ORDER — MIDAZOLAM HCL 2 MG/2ML IJ SOLN
INTRAMUSCULAR | Status: AC
  Filled 2016-12-31: qty 2

## 2016-12-31 MED ORDER — FENTANYL CITRATE (PF) 100 MCG/2ML IJ SOLN
INTRAMUSCULAR | Status: AC
  Filled 2016-12-31: qty 2

## 2016-12-31 MED ORDER — LACTATED RINGERS IV SOLN: INTRAVENOUS | Status: DC

## 2016-12-31 MED ORDER — KETOROLAC TROMETHAMINE 30 MG/ML IJ SOLN
INTRAMUSCULAR | Status: AC
  Filled 2016-12-31: qty 1

## 2016-12-31 MED ORDER — IBUPROFEN 600 MG PO TABS: 600.0000 mg | ORAL_TABLET | Freq: Four times a day (QID) | ORAL | 1 refills | Status: DC | PRN

## 2016-12-31 MED ORDER — BUPIVACAINE HCL (PF) 0.5 % IJ SOLN
INTRAMUSCULAR | Status: AC
  Filled 2016-12-31: qty 30

## 2016-12-31 MED ORDER — SCOPOLAMINE 1 MG/3DAYS TD PT72
MEDICATED_PATCH | TRANSDERMAL | Status: AC
  Administered 2016-12-31: 1.5 mg via TRANSDERMAL
  Filled 2016-12-31: qty 1

## 2016-12-31 MED ORDER — PROPOFOL 10 MG/ML IV BOLUS
INTRAVENOUS | Status: AC
  Filled 2016-12-31: qty 20

## 2016-12-31 MED ORDER — DEXAMETHASONE SODIUM PHOSPHATE 10 MG/ML IJ SOLN
INTRAMUSCULAR | Status: DC | PRN
  Administered 2016-12-31: 10 mg via INTRAVENOUS

## 2016-12-31 MED ORDER — GLYCOPYRROLATE 0.2 MG/ML IJ SOLN
INTRAMUSCULAR | Status: DC | PRN
  Administered 2016-12-31: 0.2 mg via INTRAVENOUS

## 2016-12-31 MED ORDER — LIDOCAINE HCL (CARDIAC) 20 MG/ML IV SOLN
INTRAVENOUS | Status: AC
  Filled 2016-12-31: qty 5

## 2016-12-31 MED ORDER — LACTATED RINGERS IV SOLN
INTRAVENOUS | Status: DC
  Administered 2016-12-31: 07:00:00 via INTRAVENOUS

## 2016-12-31 MED ORDER — PROPOFOL 10 MG/ML IV BOLUS
INTRAVENOUS | Status: DC | PRN
  Administered 2016-12-31: 200 mg via INTRAVENOUS

## 2016-12-31 MED ORDER — SCOPOLAMINE 1 MG/3DAYS TD PT72
1.0000 | MEDICATED_PATCH | Freq: Once | TRANSDERMAL | Status: DC
  Administered 2016-12-31: 1.5 mg via TRANSDERMAL

## 2016-12-31 MED ORDER — FENTANYL CITRATE (PF) 100 MCG/2ML IJ SOLN
INTRAMUSCULAR | Status: DC | PRN
  Administered 2016-12-31: 25 ug via INTRAVENOUS
  Administered 2016-12-31: 50 ug via INTRAVENOUS
  Administered 2016-12-31: 25 ug via INTRAVENOUS

## 2016-12-31 MED ORDER — SODIUM CHLORIDE 0.9 % IR SOLN
Status: DC | PRN
  Administered 2016-12-31: 3000 mL

## 2016-12-31 MED ORDER — ONDANSETRON HCL 4 MG/2ML IJ SOLN
INTRAMUSCULAR | Status: DC | PRN
Start: 2016-12-31 — End: 2016-12-31
  Administered 2016-12-31: 4 mg via INTRAVENOUS

## 2016-12-31 MED ORDER — GLYCOPYRROLATE 0.2 MG/ML IJ SOLN
INTRAMUSCULAR | Status: AC
  Filled 2016-12-31: qty 1

## 2016-12-31 MED ORDER — FENTANYL CITRATE (PF) 100 MCG/2ML IJ SOLN: 25.0000 ug | INTRAMUSCULAR | Status: DC | PRN

## 2016-12-31 MED ORDER — BUPIVACAINE HCL (PF) 0.5 % IJ SOLN
INTRAMUSCULAR | Status: DC | PRN
  Administered 2016-12-31: 20 mL

## 2016-12-31 MED ORDER — MIDAZOLAM HCL 2 MG/2ML IJ SOLN
INTRAMUSCULAR | Status: DC | PRN
  Administered 2016-12-31: 2 mg via INTRAVENOUS

## 2016-12-31 MED ORDER — ONDANSETRON HCL 4 MG/2ML IJ SOLN
INTRAMUSCULAR | Status: AC
  Filled 2016-12-31: qty 2

## 2016-12-31 MED ORDER — MEGESTROL ACETATE 40 MG PO TABS: 40.0000 mg | ORAL_TABLET | Freq: Two times a day (BID) | ORAL | 3 refills | Status: DC

## 2016-12-31 MED ORDER — DEXAMETHASONE SODIUM PHOSPHATE 10 MG/ML IJ SOLN
INTRAMUSCULAR | Status: AC
  Filled 2016-12-31: qty 1

## 2016-12-31 MED ORDER — MEPERIDINE HCL 25 MG/ML IJ SOLN: 6.2500 mg | INTRAMUSCULAR | Status: DC | PRN

## 2016-12-31 MED ORDER — LIDOCAINE HCL (CARDIAC) 20 MG/ML IV SOLN
INTRAVENOUS | Status: DC | PRN
  Administered 2016-12-31: 80 mg via INTRAVENOUS

## 2016-12-31 MED ORDER — KETOROLAC TROMETHAMINE 30 MG/ML IJ SOLN
INTRAMUSCULAR | Status: DC | PRN
Start: 2016-12-31 — End: 2016-12-31
  Administered 2016-12-31: 30 mg via INTRAVENOUS

## 2016-12-31 MED ORDER — METOCLOPRAMIDE HCL 5 MG/ML IJ SOLN: 10.0000 mg | Freq: Once | INTRAMUSCULAR | Status: DC | PRN

## 2016-12-31 SURGICAL SUPPLY — 16 items
CATH ROBINSON RED A/P 16FR (CATHETERS) ×3 IMPLANT
CONTAINER PREFILL 10% NBF 60ML (FORM) IMPLANT
DEVICE MYOSURE LITE (MISCELLANEOUS) ×3 IMPLANT
DEVICE MYOSURE REACH (MISCELLANEOUS) IMPLANT
FILTER ARTHROSCOPY CONVERTOR (FILTER) ×3 IMPLANT
GLOVE BIO SURGEON STRL SZ7 (GLOVE) ×3 IMPLANT
GLOVE BIOGEL PI IND STRL 7.0 (GLOVE) ×2 IMPLANT
GLOVE BIOGEL PI INDICATOR 7.0 (GLOVE) ×4
GOWN STRL REUS W/TWL LRG LVL3 (GOWN DISPOSABLE) ×3 IMPLANT
GOWN STRL REUS W/TWL XL LVL3 (GOWN DISPOSABLE) ×3 IMPLANT
PACK VAGINAL MINOR WOMEN LF (CUSTOM PROCEDURE TRAY) ×3 IMPLANT
PAD OB MATERNITY 4.3X12.25 (PERSONAL CARE ITEMS) ×3 IMPLANT
SEAL ROD LENS SCOPE MYOSURE (ABLATOR) ×3 IMPLANT
TOWEL OR 17X24 6PK STRL BLUE (TOWEL DISPOSABLE) ×6 IMPLANT
TUBING AQUILEX INFLOW (TUBING) ×3 IMPLANT
TUBING AQUILEX OUTFLOW (TUBING) ×3 IMPLANT

## 2016-12-31 NOTE — Anesthesia Preprocedure Evaluation (Signed)
Anesthesia Evaluation  Patient identified by MRN, date of birth, ID band Patient awake    Reviewed: Allergy & Precautions, NPO status , Patient's Chart, lab work & pertinent test results  Airway Mallampati: II  TM Distance: >3 FB Neck ROM: Full    Dental no notable dental hx.    Pulmonary neg pulmonary ROS, former smoker,    Pulmonary exam normal breath sounds clear to auscultation       Cardiovascular negative cardio ROS Normal cardiovascular exam Rhythm:Regular Rate:Normal     Neuro/Psych negative neurological ROS  negative psych ROS   GI/Hepatic negative GI ROS, Neg liver ROS,   Endo/Other  negative endocrine ROS  Renal/GU negative Renal ROS  negative genitourinary   Musculoskeletal negative musculoskeletal ROS (+)   Abdominal   Peds negative pediatric ROS (+)  Hematology negative hematology ROS (+)   Anesthesia Other Findings   Reproductive/Obstetrics negative OB ROS                             Anesthesia Physical Anesthesia Plan  ASA: II  Anesthesia Plan: General   Post-op Pain Management:    Induction: Intravenous  PONV Risk Score and Plan: 4 or greater and Ondansetron, Dexamethasone, Midazolam, Scopolamine patch - Pre-op and Treatment may vary due to age or medical condition  Airway Management Planned: LMA  Additional Equipment:   Intra-op Plan:   Post-operative Plan: Extubation in OR  Informed Consent: I have reviewed the patients History and Physical, chart, labs and discussed the procedure including the risks, benefits and alternatives for the proposed anesthesia with the patient or authorized representative who has indicated his/her understanding and acceptance.   Dental advisory given  Plan Discussed with: CRNA  Anesthesia Plan Comments:         Anesthesia Quick Evaluation

## 2016-12-31 NOTE — Anesthesia Procedure Notes (Signed)
Procedure Name: LMA Insertion Date/Time: 12/31/2016 7:29 AM Performed by: Montez Hageman Pre-anesthesia Checklist: Patient identified, Patient being monitored, Emergency Drugs available, Timeout performed and Suction available Patient Re-evaluated:Patient Re-evaluated prior to induction Oxygen Delivery Method: Circle System Utilized Preoxygenation: Pre-oxygenation with 100% oxygen Induction Type: IV induction Ventilation: Mask ventilation without difficulty LMA: LMA inserted and LMA with gastric port inserted LMA Size: 4.0 Number of attempts: 1 Placement Confirmation: positive ETCO2 and breath sounds checked- equal and bilateral

## 2016-12-31 NOTE — H&P (Signed)
Preoperative History and Physical  Cathy Walls is a 36 y.o. G3P1011 here for surgical management of AUB and uterine fibroids.  Pt wishes to maintain her fertility.  Proposed surgery: hysteroscopy with dilation and curettage with hysteroscopic myomectomy if needed  Past Medical History:  Diagnosis Date  . Abnormal pap    cryotherapy  . Anemia   . Diabetes mellitus without complication (HCC)    PRE DIABETIC, NO MEDS  . GERD (gastroesophageal reflux disease)   . Obesity    Past Surgical History:  Procedure Laterality Date  . CRYOTHERAPY  2012  . DILATION AND CURETTAGE OF UTERUS     post SAB   OB History    Gravida Para Term Preterm AB Living   3 1 1   1 1    SAB TAB Ectopic Multiple Live Births   1             Patient denies any cervical dysplasia or STIs. No prescriptions prior to admission.    No Known Allergies Social History:   reports that she has quit smoking. Her smoking use included Cigarettes. She has a 0.50 pack-year smoking history. She has never used smokeless tobacco. She reports that she drinks alcohol. She reports that she does not use drugs. Family History  Problem Relation Age of Onset  . Liver disease Mother 3    Review of Systems: Noncontributory  PHYSICAL EXAM: Height 5' 9.5" (1.765 m), weight 250 lb (113.4 kg). General appearance - alert, well appearing, and in no distress Chest - clear to auscultation, no wheezes, rales or rhonchi, symmetric air entry Heart - normal rate and regular rhythm Abdomen - soft, nontender, nondistended, no masses or organomegaly Pelvic - examination not indicated Extremities - peripheral pulses normal, no pedal edema, no clubbing or cyanosis  Labs: No results found for this or any previous visit (from the past 336 hour(s)).  Imaging Studies: CBC    Component Value Date/Time   WBC 6.3 12/31/2016 0616   RBC 4.50 12/31/2016 0616   HGB 11.2 (L) 12/31/2016 0616   HGB 10.2 (L) 11/07/2016 1052   HCT 35.3 (L)  12/31/2016 0616   HCT 31.9 (L) 11/07/2016 1052   PLT 452 (H) 12/31/2016 0616   PLT 426 (H) 11/07/2016 1052   MCV 78.4 12/31/2016 0616   MCV 81 11/07/2016 1052   MCH 24.9 (L) 12/31/2016 0616   MCHC 31.7 12/31/2016 0616   RDW 13.8 12/31/2016 0616   RDW 15.1 11/07/2016 1052     Assessment: Patient Active Problem List   Diagnosis Date Noted  . Recurrent UTI 12/23/2012  . Morbid obesity ([place]) 10/08/2012  . Vaginal discharge 09/30/2012    Plan: Patient will undergo surgical management with hysteroscopy with dilation and curettage with hysteroscopic myomectomy if needed.   The risks of surgery were discussed in detail with the patient including but not limited to: bleeding which may require transfusion or reoperation; infection which may require antibiotics; injury to surrounding organs which may involve bowel, bladder, ureters ; need for additional procedures including laparoscopy or laparotomy; thromboembolic phenomenon, surgical site problems and other postoperative/anesthesia complications. Likelihood of success in alleviating the patient's condition was discussed. Routine postoperative instructions will be reviewed with the patient and her family in detail after surgery.  The patient concurred with the proposed plan, giving informed written consent for the surgery.  Patient has been NPO since last night she will remain NPO for procedure.  Anesthesia and OR aware.  Preoperative prophylactic antibiotics and SCDs  ordered on call to the OR.  To OR when ready.  Cathy Buswell L. Walls, M.D., [facility] 12/31/2016 6:07 AM

## 2016-12-31 NOTE — Brief Op Note (Signed)
12/31/2016  9:59 AM  PATIENT:  Cathy Walls  36 y.o. female  PRE-OPERATIVE DIAGNOSIS:  AUB Fibroids  POST-OPERATIVE DIAGNOSIS:  abnormal uterine bleeding, fibroids  PROCEDURE:  Procedure(s): DILATATION & CURETTAGE/HYSTEROSCOPY WITH MYOSURE AT BEDSIDE NOT OPENED AND POSSIBLE RESECTION OF FIBROID (N/A)  SURGEON:  Surgeon(s) and Role:    * Lavonia Drafts, MD - Primary  ANESTHESIA:   general  EBL:  Total I/O In: 800 [I.V.:800] Out: 35 [Urine:20; Blood:15]  BLOOD ADMINISTERED:none  DRAINS: none   LOCAL MEDICATIONS USED:  MARCAINE     SPECIMEN:  Source of Specimen:  endometrial curettings  DISPOSITION OF SPECIMEN:  PATHOLOGY  COUNTS:  YES  TOURNIQUET:  * No tourniquets in log *  DICTATION: .Note written in EPIC  PLAN OF CARE: Discharge to home after PACU  PATIENT DISPOSITION:  PACU - hemodynamically stable.   Delay start of Pharmacological VTE agent (>24hrs) due to surgical blood loss or risk of bleeding: not applicable  Complications: none immediate  Gaelyn Tukes L. Harraway-Smith, M.D., [facility]

## 2016-12-31 NOTE — Anesthesia Postprocedure Evaluation (Signed)
Anesthesia Post Note  Patient: Cathy Walls  Procedure(s) Performed: Procedure(s) (LRB): DILATATION & CURETTAGE/HYSTEROSCOPY WITH MYOSURE AT BEDSIDE NOT OPENED AND POSSIBLE RESECTION OF FIBROID (N/A)     Patient location during evaluation: PACU Anesthesia Type: General Level of consciousness: awake and alert Pain management: pain level controlled Vital Signs Assessment: post-procedure vital signs reviewed and stable Respiratory status: spontaneous breathing, nonlabored ventilation, respiratory function stable and patient connected to nasal cannula oxygen Cardiovascular status: blood pressure returned to baseline and stable Postop Assessment: no apparent nausea or vomiting Anesthetic complications: no    Last Vitals:  Vitals:   12/31/16 1015 12/31/16 1030  BP: 121/84 128/74  Pulse: 85   Resp: 20 18  Temp:  36.7 C  SpO2: 92% 93%    Last Pain:  Vitals:   12/31/16 1030  TempSrc:   PainSc: 2    Pain Goal: Patients Stated Pain Goal: 3 (12/31/16 0631)               Montez Hageman

## 2016-12-31 NOTE — Discharge Instructions (Signed)
Hysteroscopy, Care After Refer to this sheet in the next few weeks. These instructions provide you with information on caring for yourself after your procedure. Your health care provider may also give you more specific instructions. Your treatment has been planned according to current medical practices, but problems sometimes occur. Call your health care provider if you have any problems or questions after your procedure. What can I expect after the procedure? After your procedure, it is typical to have the following:  You may have some cramping. This normally lasts for a couple days.  You may have bleeding. This can vary from light spotting for a few days to menstrual-like bleeding for 3-7 days.  Follow these instructions at home:  Rest for the first 1-2 days after the procedure.  Only take over-the-counter or prescription medicines as directed by your health care provider. Do not take aspirin. It can increase the chances of bleeding.  Take showers instead of baths for 2 weeks or as directed by your health care provider.  Do not drive for 24 hours or as directed.  Do not drink alcohol while taking pain medicine.  Do not use tampons, douche, or have sexual intercourse for 2 weeks or until your health care provider says it is okay.  Take your temperature twice a day for 4-5 days. Write it down each time.  Follow your health care provider's advice about diet, exercise, and lifting.  If you develop constipation, you may: ? Take a mild laxative if your health care provider approves. ? Add bran foods to your diet. ? Drink enough fluids to keep your urine clear or pale yellow.  Try to have someone with you or available to you for the first 24-48 hours, especially if you were given a general anesthetic.  Follow up with your health care provider as directed. Contact a health care provider if:  You feel dizzy or lightheaded.  You feel sick to your stomach (nauseous).  You have  abnormal vaginal discharge.  You have a rash.  You have pain that is not controlled with medicine. Get help right away if:  You have bleeding that is heavier than a normal menstrual period.  You have a fever.  You have increasing cramps or pain, not controlled with medicine.  You have new belly (abdominal) pain.  You pass out.  You have pain in the tops of your shoulders (shoulder strap areas).  You have shortness of breath. This information is not intended to replace advice given to you by your health care provider. Make sure you discuss any questions you have with your health care provider. Document Released: 01/21/2013 Document Revised: 09/08/2015 Document Reviewed: 10/30/2012 Elsevier Interactive Patient Education  2017 [place]: D&C / D&E The following instructions have been prepared to help you care for yourself upon your return home.   Personal hygiene:  Use sanitary pads for vaginal drainage, not tampons.  Shower the day after your procedure.  NO tub baths, pools or Jacuzzis for 2-3 weeks.  Wipe front to back after using the bathroom.  Activity and limitations:  Do NOT drive or operate any equipment for 24 hours. The effects of anesthesia are still present and drowsiness may result.  Do NOT rest in bed all day.  Walking is encouraged.  Walk up and down stairs slowly.  You may resume your normal activity in one to two days or as indicated by your physician.  Sexual activity: NO intercourse for at least 2 weeks after the  procedure, or as indicated by your physician.  Diet: Eat a light meal as desired this evening. You may resume your usual diet tomorrow.  Return to work: You may resume your work activities in one to two days or as indicated by your doctor.  What to expect after your surgery: Expect to have vaginal bleeding/discharge for 2-3 days and spotting for up to 10 days. It is not unusual to have soreness for up to 1-2  weeks. You may have a slight burning sensation when you urinate for the first day. Mild cramps may continue for a couple of days. You may have a regular period in 2-6 weeks.  Call your doctor for any of the following:  Excessive vaginal bleeding, saturating and changing one pad every hour.  Inability to urinate 6 hours after discharge from hospital.  Pain not relieved by pain medication.  Fever of 100.4 F or greater.  Unusual vaginal discharge or odor.   Call for an appointment:    Patients signature: ______________________  Nurses signature ________________________  Support person's signature_______________________

## 2016-12-31 NOTE — Op Note (Signed)
12/31/2016  9:59 AM  PATIENT:  Cathy Walls  36 y.o. female  PRE-OPERATIVE DIAGNOSIS:  AUB Fibroids  POST-OPERATIVE DIAGNOSIS:  abnormal uterine bleeding, fibroids  PROCEDURE:  Procedure(s): DILATATION & CURETTAGE/HYSTEROSCOPY WITH MYOSURE AT BEDSIDE NOT OPENED AND POSSIBLE RESECTION OF FIBROID (N/A)  SURGEON:  Surgeon(s) and Role:    * Lavonia Drafts, MD - Primary  ANESTHESIA:   general  EBL:  Total I/O In: 800 [I.V.:800] Out: 35 [Urine:20; Blood:15]  BLOOD ADMINISTERED:none  DRAINS: none   LOCAL MEDICATIONS USED:  MARCAINE     SPECIMEN:  Source of Specimen:  endometrial curettings  DISPOSITION OF SPECIMEN:  PATHOLOGY  COUNTS:  YES  TOURNIQUET:  * No tourniquets in log *  DICTATION: .Note written in EPIC  PLAN OF CARE: Discharge to home after PACU  PATIENT DISPOSITION:  PACU - hemodynamically stable.   Delay start of Pharmacological VTE agent (>24hrs) due to surgical blood loss or risk of bleeding: not applicable  Complications: none immediate  INDICATIONS: 36 y.o. G3P1011  here for scheduled surgery for AUB.   Risks of surgery were discussed with the patient including but not limited to: bleeding which may require transfusion; infection which may require antibiotics; injury to uterus or surrounding organs; intrauterine scarring which may impair future fertility; need for additional procedures including laparotomy or laparoscopy; and other postoperative/anesthesia complications. Written informed consent was obtained.    FINDINGS:  A 8 week size uterus.  Diffuse proliferative endometrium.  Normal ostia bilaterally.  PROCEDURE DETAILS:  The patient was taken to the operating room where general anesthesia was administered and was found to be adequate.  After an adequate timeout was performed, she was placed in the dorsal lithotomy position and examined; then prepped and draped in the sterile manner.   Her bladder was catheterized for an unmeasured amount of  clear, yellow urine. A speculum was then placed in the patient's vagina and a single tooth tenaculum was applied to the anterior lip of the cervix.   A paracervical nerve block was performed using .5% Marcain in the amount of 10cc at 4 and 7 o'clock.  The cervix was sounded to 8cm and dilated manually with metal dilators to accommodate the 8 mm diagnostic Myosure hysteroscope.  Once the cervix was dilated, the hysteroscope was inserted under direct visualization using 1.5% glycine as a suspension medium.  The uterine cavity was carefully examined, both ostia were recognized, and diffusely proliferative endometrium was noted.   After further careful visualization of the uterine cavity, the hysteroscope was removed under direct visualization.  A sharp curettage was then performed to obtain a moderate amount of endometrial curettings.  The hysteroscope was reinserted and the cavity was found to be intact with no lesions but, still an excessive amount of tissue. Using the Myosure lyte, a curettage of the endometrium was performed. At this point the cavity was found to be intact with no lesions and a smooth lining.   The tenaculum was removed from the anterior lip of the cervix and the vaginal speculum was removed after noting good hemostasis.  The patient tolerated the procedure well and was taken to the recovery area awake, extubated and in stable condition.  The patient will be discharged to home as per PACU criteria.  Routine postoperative instructions given.  She was prescribed Ibuprofen and Megace.  She will follow up in the clinic in 4 weeks for postoperative evaluation.  Jaelyn Bourgoin L. Harraway-Smith, M.D., [facility]

## 2016-12-31 NOTE — Transfer of Care (Signed)
Immediate Anesthesia Transfer of Care Note  Patient: Cathy Walls  Procedure(s) Performed: Procedure(s): DILATATION & CURETTAGE/HYSTEROSCOPY WITH MYOSURE AT BEDSIDE NOT OPENED AND POSSIBLE RESECTION OF FIBROID (N/A)  Patient Location: PACU  Anesthesia Type:General  Level of Consciousness: awake, alert  and oriented  Airway & Oxygen Therapy: Patient Spontanous Breathing, Patient connected to nasal cannula oxygen and Patient connected to face mask oxygen  Post-op Assessment: Report given to RN, Post -op Vital signs reviewed and stable and Patient moving all extremities X 4  Post vital signs: Reviewed and stable  Last Vitals:  Vitals:   12/31/16 0631  BP: 127/74  Pulse: 90  Resp: 16  Temp: 36.8 C  SpO2: 100%    Last Pain:  Vitals:   12/31/16 0631  TempSrc: Oral      Patients Stated Pain Goal: 3 (94/80/16 5537)  Complications: No apparent anesthesia complications

## 2017-01-01 ENCOUNTER — Telehealth: Payer: Self-pay | Admitting: Obstetrics & Gynecology

## 2017-01-01 ENCOUNTER — Encounter (HOSPITAL_COMMUNITY): Payer: Self-pay | Admitting: Obstetrics & Gynecology

## 2017-01-01 NOTE — Telephone Encounter (Signed)
TC to pt to review her surgery and rec Megace 40mg  bid.   She c/o a sore throat and some mild cramping but, no other problems.   All questions answered.   F/u in 4 -6 weeks or sooner prn.  Cathy Walls L. Harraway-Smith, M.D., [facility]

## 2017-03-13 ENCOUNTER — Ambulatory Visit (INDEPENDENT_AMBULATORY_CARE_PROVIDER_SITE_OTHER): Payer: BLUE CROSS/BLUE SHIELD | Admitting: Obstetrics & Gynecology

## 2017-03-13 ENCOUNTER — Encounter: Payer: Self-pay | Admitting: Obstetrics & Gynecology

## 2017-03-13 VITALS — BP 141/78 | HR 89 | Wt 252.6 lb

## 2017-03-13 DIAGNOSIS — N939 Abnormal uterine and vaginal bleeding, unspecified: Secondary | ICD-10-CM

## 2017-03-13 DIAGNOSIS — N3000 Acute cystitis without hematuria: Secondary | ICD-10-CM

## 2017-03-13 LAB — POCT URINALYSIS DIP (DEVICE)
BILIRUBIN URINE: NEGATIVE
Glucose, UA: NEGATIVE mg/dL
KETONES UR: NEGATIVE mg/dL
Leukocytes, UA: NEGATIVE
Nitrite: NEGATIVE
PH: 6 (ref 5.0–8.0)
Protein, ur: 100 mg/dL — AB
Specific Gravity, Urine: >=1.03 (ref 1.005–1.030)
Urobilinogen, UA: 2 mg/dL — ABNORMAL HIGH (ref 0.0–1.0)

## 2017-03-13 MED ORDER — SULFAMETHOXAZOLE-TRIMETHOPRIM 800-160 MG PO TABS: 1.0000 | ORAL_TABLET | Freq: Two times a day (BID) | ORAL | 0 refills | Status: DC

## 2017-03-13 NOTE — Progress Notes (Signed)
History:  36 y.o. G3P1011 here today for post op check. She is s/p hysteroscopy 12/31/2016.  Pt reports freq urination. She denies hematuria. She also denies F/C. She reports no menses since the procedure.  She would like to conceive. Pt has stopped taking the Megace.  The following portions of the patient's history were reviewed and updated as appropriate: allergies, current medications, past family history, past medical history, past social history, past surgical history and problem list.  Review of Systems:  Pertinent items are noted in HPI.   Objective:  Physical Exam BP (!) 141/78   Pulse 89   Wt 252 lb 9.6 oz (114.6 kg)   LMP 12/15/2016 (Approximate)   BMI 37.30 kg/m   CONSTITUTIONAL: Well-developed, well-nourished female in no acute distress.  HENT:  Normocephalic, atraumatic EYES: Conjunctivae and EOM are normal. No scleral icterus.  NECK: Normal range of motion SKIN: Skin is warm and dry. No rash noted. Not diaphoretic.No pallor. [place]: Alert and oriented to person, place, and time. Normal coordination.  Abd: Soft, nontender and nondistended. No suprapubic tenderness. Pelvic: deferred  Labs and Imaging 12/31/2016 Diagnosis Endometrium, curettage - BENIGN ENDOMETRIAL TYPE POLYP. - BENIGN SMOOTH MUSCLE CONSISTENT WITH LEIOMYOMA.  UA: Mod hgb   Assessment & Plan:  2 month post op check- doing well Suspect UTI  Bactrim 1 po bid x 5 days F/u in 1 year  Pt will call if no return to menses in the next 4-6 weeks reviewed surg path  Total face-to-face time with patient was 20 min.  Greater than 50% was spent in counseling and coordination of care with the patient.  Laneka Mcgrory L. Harraway-Smith, M.D., [facility]

## 2017-03-14 ENCOUNTER — Encounter: Payer: Self-pay | Admitting: Obstetrics & Gynecology

## 2017-06-13 ENCOUNTER — Emergency Department (HOSPITAL_BASED_OUTPATIENT_CLINIC_OR_DEPARTMENT_OTHER): Payer: BLUE CROSS/BLUE SHIELD

## 2017-06-13 ENCOUNTER — Other Ambulatory Visit: Payer: Self-pay

## 2017-06-13 ENCOUNTER — Emergency Department (HOSPITAL_BASED_OUTPATIENT_CLINIC_OR_DEPARTMENT_OTHER)
Admission: EM | Admit: 2017-06-13 | Discharge: 2017-06-13 | Disposition: A | Discharge status: Home / Self Care | Payer: BLUE CROSS/BLUE SHIELD | Attending: Emergency Medicine | Admitting: Emergency Medicine

## 2017-06-13 ENCOUNTER — Encounter (HOSPITAL_BASED_OUTPATIENT_CLINIC_OR_DEPARTMENT_OTHER): Payer: Self-pay | Admitting: *Deleted

## 2017-06-13 DIAGNOSIS — N83202 Unspecified ovarian cyst, left side: Secondary | ICD-10-CM

## 2017-06-13 DIAGNOSIS — Z87891 Personal history of nicotine dependence: Secondary | ICD-10-CM | POA: Diagnosis not present

## 2017-06-13 DIAGNOSIS — R102 Pelvic and perineal pain: Secondary | ICD-10-CM

## 2017-06-13 DIAGNOSIS — Z79899 Other long term (current) drug therapy: Secondary | ICD-10-CM | POA: Diagnosis not present

## 2017-06-13 DIAGNOSIS — N83292 Other ovarian cyst, left side: Secondary | ICD-10-CM | POA: Insufficient documentation

## 2017-06-13 LAB — URINALYSIS, ROUTINE W REFLEX MICROSCOPIC
BILIRUBIN URINE: NEGATIVE
Glucose, UA: NEGATIVE mg/dL
Ketones, ur: NEGATIVE mg/dL
Nitrite: NEGATIVE
PH: 5.5 (ref 5.0–8.0)
Protein, ur: 30 mg/dL — AB
SPECIFIC GRAVITY, URINE: 1.025 (ref 1.005–1.030)

## 2017-06-13 LAB — PREGNANCY, URINE: Preg Test, Ur: NEGATIVE

## 2017-06-13 LAB — WET PREP, GENITAL
Clue Cells Wet Prep HPF POC: NONE SEEN
SPERM: NONE SEEN
Trich, Wet Prep: NONE SEEN
Yeast Wet Prep HPF POC: NONE SEEN

## 2017-06-13 LAB — URINALYSIS, MICROSCOPIC (REFLEX)

## 2017-06-13 MED ORDER — IBUPROFEN 800 MG PO TABS
800.0000 mg | ORAL_TABLET | Freq: Once | ORAL | Status: AC
  Administered 2017-06-13: 800 mg via ORAL
  Filled 2017-06-13: qty 1

## 2017-06-13 NOTE — ED Notes (Signed)
ED Provider at bedside. 

## 2017-06-13 NOTE — ED Triage Notes (Signed)
Pt reports pelvic cramping since Saturday, has not had period since her d&c in September and thought she was having her first menstrual period, cramping cont and is on left side radiating at times to right pelvic area. Denies any bleeding or d/c.

## 2017-06-13 NOTE — Discharge Instructions (Signed)
Take ibuprofen 600 mg 4 times daily as needed for discomfort.  Take the medication with food as it can cause upset stomach.  Call Dr. Ihor Dow tomorrow to arrange to be seen in the office.  We suggest that you have a repeat ultrasound study of your pelvic organs within the next 6-12 weeks

## 2017-06-13 NOTE — ED Provider Notes (Signed)
[facility] Provider Note   CSN: [id number] Arrival date & time: 06/13/17  0930     History   Chief Complaint Chief Complaint  Patient presents with  . Abdominal Pain    HPI Cathy Walls is a 37 y.o. female.  Signs of lower abdominal pain feels like menstrual cramps left side greater than right side onset 5 days ago.  She treated herself with Colace because she thought she may be constipated though she had a normal bowel movement today and a normal bowel movement yesterday.  Colace did not provide any relief.  Last normal menstrual period was July 2018.  She subsequently had D&C for "fibroids".  No nausea no vomiting no fever.  No other associated symptoms.  Nothing makes symptoms better or worse.  Denies vaginal discharge.  HPI  Past Medical History:  Diagnosis Date  . Abnormal pap    cryotherapy  . Anemia   . GERD (gastroesophageal reflux disease)   . Obesity     Patient Active Problem List   Diagnosis Date Noted  . Abnormal uterine bleeding (AUB) 12/31/2016  . Recurrent UTI 12/23/2012  . Morbid obesity ([place]) 10/08/2012  . Vaginal discharge 09/30/2012    Past Surgical History:  Procedure Laterality Date  . CRYOTHERAPY  2012  . DILATATION & CURETTAGE/HYSTEROSCOPY WITH MYOSURE N/A 12/31/2016   Procedure: DILATATION & CURETTAGE/HYSTEROSCOPY WITH MYOSURE AT BEDSIDE NOT OPENED AND POSSIBLE RESECTION OF FIBROID;  Surgeon: Lavonia Drafts, MD;  Location: [facility];  Service: Gynecology;  Laterality: N/A;    OB History    Gravida Para Term Preterm AB Living   3 1 1   1 1    SAB TAB Ectopic Multiple Live Births   1               Home Medications    Prior to Admission medications   Medication Sig Start Date End Date Taking? Authorizing Provider  esomeprazole (NEXIUM) 20 MG capsule Take 20 mg by mouth daily as needed.    [provider]  Ferrous Sulfate (SLOW FE PO) Take 1 tablet by mouth every morning.    [provider]  ibuprofen (ADVIL,MOTRIN) 600 MG tablet Take 1 tablet (600 mg total) by mouth every 6 (six) hours as needed. Patient not taking: Reported on 03/13/2017 12/31/16   Lavonia Drafts, MD  megestrol (MEGACE) 40 MG tablet Take 1 tablet (40 mg total) by mouth 2 (two) times daily. Can increase to two tablets twice a day in the event of heavy bleeding Patient not taking: Reported on 03/13/2017 12/31/16   Lavonia Drafts, MD  sulfamethoxazole-trimethoprim (BACTRIM DS,SEPTRA DS) 800-160 MG tablet Take 1 tablet by mouth 2 (two) times daily. 03/13/17   Lavonia Drafts, MD    Family History Family History  Problem Relation Age of Onset  . Liver disease Mother 67    Social History Social History   Tobacco Use  . Smoking status: Former Smoker    Packs/day: 0.25    Years: 2.00    Pack years: 0.50    Types: Cigarettes  . Smokeless tobacco: Never Used  Substance Use Topics  . Alcohol use: Yes    Comment: occassionally  . Drug use: No     Allergies   Patient has no known allergies.   Review of Systems Review of Systems  Gastrointestinal: Positive for abdominal pain.  Genitourinary:       Amenorrhea  All other systems reviewed and are negative.    Physical  Exam Updated Vital Signs BP 136/79 (BP Location: Left Arm)   Pulse 79   Temp 99 F (37.2 C) (Oral)   Resp 18   Ht 5\' 10"  (1.778 m)   Wt 115.7 kg (255 lb)   SpO2 99%   BMI 36.59 kg/m   Physical Exam  Constitutional: She appears well-developed and well-nourished.  HENT:  Head: Normocephalic and atraumatic.  Eyes: Conjunctivae are normal. Pupils are equal, round, and reactive to light.  Neck: Neck supple. No tracheal deviation present. No thyromegaly present.  Cardiovascular: Normal rate and regular rhythm.  No murmur heard. Pulmonary/Chest: Effort normal and breath sounds normal.  Abdominal: Soft. Bowel sounds are normal. She exhibits no distension. There is no tenderness.  Obese    Genitourinary:  Genitourinary Comments: Pelvic exam no external lesion.  Slight amount of yellowish discharge in vault.  Cervical os closed.  No cervical motion tenderness positive left adnexal tenderness.  Musculoskeletal: Normal range of motion. She exhibits no edema or tenderness.  Neurological: She is alert. Coordination normal.  Skin: Skin is warm and dry. No rash noted.  Psychiatric: She has a normal mood and affect.  Nursing note and vitals reviewed.    ED Treatments / Results  Labs (all labs ordered are listed, but only abnormal results are displayed) Labs Reviewed  URINALYSIS, ROUTINE W REFLEX MICROSCOPIC  PREGNANCY, URINE    EKG  EKG Interpretation None       Radiology No results found.  Procedures Procedures (including critical care time)  Medications Ordered in ED Medications - No data to display  Results for orders placed or performed during the hospital encounter of 06/13/17  Wet prep, genital  Result Value Ref Range   Yeast Wet Prep HPF POC NONE SEEN NONE SEEN   Trich, Wet Prep NONE SEEN NONE SEEN   Clue Cells Wet Prep HPF POC NONE SEEN NONE SEEN   WBC, Wet Prep HPF POC MANY (A) NONE SEEN   Sperm NONE SEEN   Urinalysis, Routine w reflex microscopic  Result Value Ref Range   Color, Urine YELLOW YELLOW   APPearance CLEAR CLEAR   Specific Gravity, Urine 1.025 1.005 - 1.030   pH 5.5 5.0 - 8.0   Glucose, UA NEGATIVE NEGATIVE mg/dL   Hgb urine dipstick LARGE (A) NEGATIVE   Bilirubin Urine NEGATIVE NEGATIVE   Ketones, ur NEGATIVE NEGATIVE mg/dL   Protein, ur 30 (A) NEGATIVE mg/dL   Nitrite NEGATIVE NEGATIVE   Leukocytes, UA TRACE (A) NEGATIVE  Pregnancy, urine  Result Value Ref Range   Preg Test, Ur NEGATIVE NEGATIVE  Urinalysis, Microscopic (reflex)  Result Value Ref Range   RBC / HPF 6-30 0 - 5 RBC/hpf   WBC, UA 0-5 0 - 5 WBC/hpf   Bacteria, UA MANY (A) NONE SEEN   Squamous Epithelial / LPF 6-30 (A) NONE SEEN   US Pelvis Transvanginal  Non-ob (tv Only)  Result Date: 06/13/2017 CLINICAL DATA:  Pelvic pain EXAM: TRANSABDOMINAL AND TRANSVAGINAL ULTRASOUND OF PELVIS DOPPLER ULTRASOUND OF OVARIES TECHNIQUE: Study was performed transabdominally to optimize pelvic field of view evaluation and transvaginally to optimize internal visceral architecture evaluation. Color and duplex Doppler ultrasound was utilized to evaluate blood flow to the ovaries. COMPARISON:  None. FINDINGS: Uterus Measurements: 12.0 x 5.9 x 6.2 cm. There is a hypoechoic mass arising from the superior uterine fundus measuring 4.7 x 3.9 x 4.9 cm, a presumed leiomyoma. Endometrium Thickness: 4 mm. There is a mass in the midline lower uterine segment which either  represents a leiomyoma or endometrial polyp measuring 1.7 x 0.8 x 1.3 cm. Endometrium otherwise appears normal. Right ovary Measurements: 3.6 x 2.5 x 1.6 cm. Normal appearance/no adnexal mass. Left ovary Measurements: 5.1 x 5.3 x 5.5 cm. There is a complex cystic appearing mass arising in the left adnexal region measuring 5.0 x 4.4 x 5.3 cm. Pulsed Doppler evaluation of both ovaries demonstrates normal low-resistance arterial and venous waveforms. Other findings There is a small amount of free pelvic fluid. IMPRESSION: 1. Complex cystic mass arising in the left adnexal region measuring 5.0 x 4.4 x 5.3 cm. Suspect hemorrhagic ovarian cyst. Short-interval follow up ultrasound in 6-12 weeks is recommended, preferably during the week following the patient's normal menses. 2. Hypoechoic mass at the superior uterine fundus measuring 4.7 x 3.9 x 4.9 cm, a presumed uterine leiomyoma. 3. Suspect endometrial polyp lower uterine segment. This lesion measures 1.7 x 0.8 x 1.3 cm. Alternatively, this lesion could represent a second leiomyoma in the lower uterine segment midline. A focal endometrial lesion is suspected. Consider sonohysterogram for further evaluation, prior to hysteroscopy or endometrial biopsy. 4.  Small amount of free pelvic  fluid. 5.  No ovarian torsion on either side. Electronically Signed   By: Lowella Grip III M.D.   On: 06/13/2017 13:17   US Pelvis Complete  Result Date: 06/13/2017 CLINICAL DATA:  Pelvic pain EXAM: TRANSABDOMINAL AND TRANSVAGINAL ULTRASOUND OF PELVIS DOPPLER ULTRASOUND OF OVARIES TECHNIQUE: Study was performed transabdominally to optimize pelvic field of view evaluation and transvaginally to optimize internal visceral architecture evaluation. Color and duplex Doppler ultrasound was utilized to evaluate blood flow to the ovaries. COMPARISON:  None. FINDINGS: Uterus Measurements: 12.0 x 5.9 x 6.2 cm. There is a hypoechoic mass arising from the superior uterine fundus measuring 4.7 x 3.9 x 4.9 cm, a presumed leiomyoma. Endometrium Thickness: 4 mm. There is a mass in the midline lower uterine segment which either represents a leiomyoma or endometrial polyp measuring 1.7 x 0.8 x 1.3 cm. Endometrium otherwise appears normal. Right ovary Measurements: 3.6 x 2.5 x 1.6 cm. Normal appearance/no adnexal mass. Left ovary Measurements: 5.1 x 5.3 x 5.5 cm. There is a complex cystic appearing mass arising in the left adnexal region measuring 5.0 x 4.4 x 5.3 cm. Pulsed Doppler evaluation of both ovaries demonstrates normal low-resistance arterial and venous waveforms. Other findings There is a small amount of free pelvic fluid. IMPRESSION: 1. Complex cystic mass arising in the left adnexal region measuring 5.0 x 4.4 x 5.3 cm. Suspect hemorrhagic ovarian cyst. Short-interval follow up ultrasound in 6-12 weeks is recommended, preferably during the week following the patient's normal menses. 2. Hypoechoic mass at the superior uterine fundus measuring 4.7 x 3.9 x 4.9 cm, a presumed uterine leiomyoma. 3. Suspect endometrial polyp lower uterine segment. This lesion measures 1.7 x 0.8 x 1.3 cm. Alternatively, this lesion could represent a second leiomyoma in the lower uterine segment midline. A focal endometrial lesion is  suspected. Consider sonohysterogram for further evaluation, prior to hysteroscopy or endometrial biopsy. 4.  Small amount of free pelvic fluid. 5.  No ovarian torsion on either side. Electronically Signed   By: Lowella Grip III M.D.   On: 06/13/2017 13:17   US Pelvic Doppler (torsion R/o Or Mass Arterial Flow)  Result Date: 06/13/2017 CLINICAL DATA:  Pelvic pain EXAM: TRANSABDOMINAL AND TRANSVAGINAL ULTRASOUND OF PELVIS DOPPLER ULTRASOUND OF OVARIES TECHNIQUE: Study was performed transabdominally to optimize pelvic field of view evaluation and transvaginally to optimize internal visceral architecture  evaluation. Color and duplex Doppler ultrasound was utilized to evaluate blood flow to the ovaries. COMPARISON:  None. FINDINGS: Uterus Measurements: 12.0 x 5.9 x 6.2 cm. There is a hypoechoic mass arising from the superior uterine fundus measuring 4.7 x 3.9 x 4.9 cm, a presumed leiomyoma. Endometrium Thickness: 4 mm. There is a mass in the midline lower uterine segment which either represents a leiomyoma or endometrial polyp measuring 1.7 x 0.8 x 1.3 cm. Endometrium otherwise appears normal. Right ovary Measurements: 3.6 x 2.5 x 1.6 cm. Normal appearance/no adnexal mass. Left ovary Measurements: 5.1 x 5.3 x 5.5 cm. There is a complex cystic appearing mass arising in the left adnexal region measuring 5.0 x 4.4 x 5.3 cm. Pulsed Doppler evaluation of both ovaries demonstrates normal low-resistance arterial and venous waveforms. Other findings There is a small amount of free pelvic fluid. IMPRESSION: 1. Complex cystic mass arising in the left adnexal region measuring 5.0 x 4.4 x 5.3 cm. Suspect hemorrhagic ovarian cyst. Short-interval follow up ultrasound in 6-12 weeks is recommended, preferably during the week following the patient's normal menses. 2. Hypoechoic mass at the superior uterine fundus measuring 4.7 x 3.9 x 4.9 cm, a presumed uterine leiomyoma. 3. Suspect endometrial polyp lower uterine segment. This  lesion measures 1.7 x 0.8 x 1.3 cm. Alternatively, this lesion could represent a second leiomyoma in the lower uterine segment midline. A focal endometrial lesion is suspected. Consider sonohysterogram for further evaluation, prior to hysteroscopy or endometrial biopsy. 4.  Small amount of free pelvic fluid. 5.  No ovarian torsion on either side. Electronically Signed   By: Lowella Grip III M.D.   On: 06/13/2017 13:17   Initial Impression / Assessment and Plan / ED Course  I have reviewed the triage vital signs and the nursing notes.  Pertinent labs & imaging results that were available during my care of the patient were reviewed by me and considered in my medical decision making (see chart for details).     2:50 PM feels improved after treatment with ibuprofen. Patient requires follow-up with her GYN, Dr. Ihor Dow.  She reports that she has ibuprofen 600 mg which she can take at home. Final Clinical Impressions(s) / ED Diagnoses  Diagnosis #1 pelvic pain #2 ovarian cyst  Final diagnoses:  None    ED Discharge Orders    None       Orlie Dakin, MD 06/13/17 [phone]

## 2017-06-13 NOTE — ED Notes (Signed)
Patient gone to ultrasound will obtain vitals when she returns.

## 2017-06-14 ENCOUNTER — Telehealth: Payer: Self-pay | Admitting: Obstetrics & Gynecology

## 2017-06-14 LAB — GC/CHLAMYDIA PROBE AMP (~~LOC~~) NOT AT ARMC
Chlamydia: NEGATIVE
NEISSERIA GONORRHEA: NEGATIVE

## 2017-06-14 LAB — HIV ANTIBODY (ROUTINE TESTING W REFLEX): HIV Screen 4th Generation wRfx: NONREACTIVE

## 2017-06-14 LAB — RPR: RPR Ser Ql: NONREACTIVE

## 2017-06-14 NOTE — Telephone Encounter (Signed)
Need something stronger for her pain

## 2017-06-27 ENCOUNTER — Encounter: Payer: Self-pay | Admitting: Obstetrics & Gynecology

## 2017-06-27 ENCOUNTER — Ambulatory Visit: Payer: BLUE CROSS/BLUE SHIELD | Admitting: Obstetrics & Gynecology

## 2017-06-27 VITALS — BP 119/92 | HR 87 | Ht 70.0 in | Wt 258.1 lb

## 2017-06-27 DIAGNOSIS — N83202 Unspecified ovarian cyst, left side: Secondary | ICD-10-CM

## 2017-06-27 DIAGNOSIS — R102 Pelvic and perineal pain: Secondary | ICD-10-CM | POA: Diagnosis not present

## 2017-06-27 NOTE — Progress Notes (Signed)
History:  37 y.o. [id number] here today for f/u of pelvic pain. She was seen in the ED 06/13/2017 for severe pelvic pain and was dx'd as having an ovarian cyst. She is taking Motrin every 8 hours when needed and she reports that recently she has days when she does not feel the pain at all. She denies N/V.  The following portions of the patient's history were reviewed and updated as appropriate: allergies, current medications, past family history, past medical history, past social history, past surgical history and problem list.  Review of Systems:  Pertinent items are noted in HPI.   Objective:  Physical Exam Blood pressure (!) 119/92, pulse 87, height 5\' 10"  (1.778 m), weight 258 lb 1.6 oz (117.1 kg).  CONSTITUTIONAL: Well-developed, well-nourished female in no acute distress.  HENT:  Normocephalic, atraumatic EYES: Conjunctivae and EOM are normal. No scleral icterus.  NECK: Normal range of motion SKIN: Skin is warm and dry. No rash noted. Not diaphoretic.No pallor. [place]: Alert and oriented to person, place, and time. Normal coordination.  Abd: Soft, nontender and nondistended Pelvic: Normal appearing external genitalia; normal appearing vaginal mucosa. No CMT.  Small uterus, no other palpable masses, no uterine or adnexal tenderness. Exam limited by body habitus.    Labs and Imaging US Pelvis Transvanginal Non-ob (tv Only)  Result Date: 06/13/2017 CLINICAL DATA:  Pelvic pain EXAM: TRANSABDOMINAL AND TRANSVAGINAL ULTRASOUND OF PELVIS DOPPLER ULTRASOUND OF OVARIES TECHNIQUE: Study was performed transabdominally to optimize pelvic field of view evaluation and transvaginally to optimize internal visceral architecture evaluation. Color and duplex Doppler ultrasound was utilized to evaluate blood flow to the ovaries. COMPARISON:  None. FINDINGS: Uterus Measurements: 12.0 x 5.9 x 6.2 cm. There is a hypoechoic mass arising from the superior uterine fundus measuring 4.7 x 3.9 x 4.9 cm, a presumed  leiomyoma. Endometrium Thickness: 4 mm. There is a mass in the midline lower uterine segment which either represents a leiomyoma or endometrial polyp measuring 1.7 x 0.8 x 1.3 cm. Endometrium otherwise appears normal. Right ovary Measurements: 3.6 x 2.5 x 1.6 cm. Normal appearance/no adnexal mass. Left ovary Measurements: 5.1 x 5.3 x 5.5 cm. There is a complex cystic appearing mass arising in the left adnexal region measuring 5.0 x 4.4 x 5.3 cm. Pulsed Doppler evaluation of both ovaries demonstrates normal low-resistance arterial and venous waveforms. Other findings There is a small amount of free pelvic fluid. IMPRESSION: 1. Complex cystic mass arising in the left adnexal region measuring 5.0 x 4.4 x 5.3 cm. Suspect hemorrhagic ovarian cyst. Short-interval follow up ultrasound in 6-12 weeks is recommended, preferably during the week following the patient's normal menses. 2. Hypoechoic mass at the superior uterine fundus measuring 4.7 x 3.9 x 4.9 cm, a presumed uterine leiomyoma. 3. Suspect endometrial polyp lower uterine segment. This lesion measures 1.7 x 0.8 x 1.3 cm. Alternatively, this lesion could represent a second leiomyoma in the lower uterine segment midline. A focal endometrial lesion is suspected. Consider sonohysterogram for further evaluation, prior to hysteroscopy or endometrial biopsy. 4.  Small amount of free pelvic fluid. 5.  No ovarian torsion on either side. Electronically Signed   By: Lowella Grip III M.D.   On: 06/13/2017 13:17   US Pelvis Complete  Result Date: 06/13/2017 CLINICAL DATA:  Pelvic pain EXAM: TRANSABDOMINAL AND TRANSVAGINAL ULTRASOUND OF PELVIS DOPPLER ULTRASOUND OF OVARIES TECHNIQUE: Study was performed transabdominally to optimize pelvic field of view evaluation and transvaginally to optimize internal visceral architecture evaluation. Color and duplex Doppler ultrasound was  utilized to evaluate blood flow to the ovaries. COMPARISON:  None. FINDINGS: Uterus Measurements:  12.0 x 5.9 x 6.2 cm. There is a hypoechoic mass arising from the superior uterine fundus measuring 4.7 x 3.9 x 4.9 cm, a presumed leiomyoma. Endometrium Thickness: 4 mm. There is a mass in the midline lower uterine segment which either represents a leiomyoma or endometrial polyp measuring 1.7 x 0.8 x 1.3 cm. Endometrium otherwise appears normal. Right ovary Measurements: 3.6 x 2.5 x 1.6 cm. Normal appearance/no adnexal mass. Left ovary Measurements: 5.1 x 5.3 x 5.5 cm. There is a complex cystic appearing mass arising in the left adnexal region measuring 5.0 x 4.4 x 5.3 cm. Pulsed Doppler evaluation of both ovaries demonstrates normal low-resistance arterial and venous waveforms. Other findings There is a small amount of free pelvic fluid. IMPRESSION: 1. Complex cystic mass arising in the left adnexal region measuring 5.0 x 4.4 x 5.3 cm. Suspect hemorrhagic ovarian cyst. Short-interval follow up ultrasound in 6-12 weeks is recommended, preferably during the week following the patient's normal menses. 2. Hypoechoic mass at the superior uterine fundus measuring 4.7 x 3.9 x 4.9 cm, a presumed uterine leiomyoma. 3. Suspect endometrial polyp lower uterine segment. This lesion measures 1.7 x 0.8 x 1.3 cm. Alternatively, this lesion could represent a second leiomyoma in the lower uterine segment midline. A focal endometrial lesion is suspected. Consider sonohysterogram for further evaluation, prior to hysteroscopy or endometrial biopsy. 4.  Small amount of free pelvic fluid. 5.  No ovarian torsion on either side. Electronically Signed   By: Lowella Grip III M.D.   On: 06/13/2017 13:17   US Pelvic Doppler (torsion R/o Or Mass Arterial Flow)  Result Date: 06/13/2017 CLINICAL DATA:  Pelvic pain EXAM: TRANSABDOMINAL AND TRANSVAGINAL ULTRASOUND OF PELVIS DOPPLER ULTRASOUND OF OVARIES TECHNIQUE: Study was performed transabdominally to optimize pelvic field of view evaluation and transvaginally to optimize internal  visceral architecture evaluation. Color and duplex Doppler ultrasound was utilized to evaluate blood flow to the ovaries. COMPARISON:  None. FINDINGS: Uterus Measurements: 12.0 x 5.9 x 6.2 cm. There is a hypoechoic mass arising from the superior uterine fundus measuring 4.7 x 3.9 x 4.9 cm, a presumed leiomyoma. Endometrium Thickness: 4 mm. There is a mass in the midline lower uterine segment which either represents a leiomyoma or endometrial polyp measuring 1.7 x 0.8 x 1.3 cm. Endometrium otherwise appears normal. Right ovary Measurements: 3.6 x 2.5 x 1.6 cm. Normal appearance/no adnexal mass. Left ovary Measurements: 5.1 x 5.3 x 5.5 cm. There is a complex cystic appearing mass arising in the left adnexal region measuring 5.0 x 4.4 x 5.3 cm. Pulsed Doppler evaluation of both ovaries demonstrates normal low-resistance arterial and venous waveforms. Other findings There is a small amount of free pelvic fluid. IMPRESSION: 1. Complex cystic mass arising in the left adnexal region measuring 5.0 x 4.4 x 5.3 cm. Suspect hemorrhagic ovarian cyst. Short-interval follow up ultrasound in 6-12 weeks is recommended, preferably during the week following the patient's normal menses. 2. Hypoechoic mass at the superior uterine fundus measuring 4.7 x 3.9 x 4.9 cm, a presumed uterine leiomyoma. 3. Suspect endometrial polyp lower uterine segment. This lesion measures 1.7 x 0.8 x 1.3 cm. Alternatively, this lesion could represent a second leiomyoma in the lower uterine segment midline. A focal endometrial lesion is suspected. Consider sonohysterogram for further evaluation, prior to hysteroscopy or endometrial biopsy. 4.  Small amount of free pelvic fluid. 5.  No ovarian torsion on either side. Electronically Signed  By: Lowella Grip III M.D.   On: 06/13/2017 13:17    Assessment & Plan:  Left ov cyst  F/u US in 3 months F/u Ov in 3 months after [place] Keep Motrin 800mg  po tid prn with food.  F/u sooner prn pain  Total  face-to-face time with patient was 15 min.  Greater than 50% was spent in counseling and coordination of care with the patient.    Mellany Dinsmore L. Harraway-Smith, M.D., [place]lynn June

## 2017-07-04 NOTE — Telephone Encounter (Signed)
Patient states that she saw Dr. Ihor Dow already on 06-27-17 and she still has some pain but it is manageable. Kathrene Alu RNBSN

## 2017-07-16 ENCOUNTER — Telehealth: Payer: Self-pay | Admitting: General Practice

## 2017-07-16 ENCOUNTER — Other Ambulatory Visit: Payer: Self-pay | Admitting: Obstetrics & Gynecology

## 2017-07-16 DIAGNOSIS — N83209 Unspecified ovarian cyst, unspecified side: Secondary | ICD-10-CM

## 2017-07-16 MED ORDER — TRAMADOL HCL 50 MG PO TABS: 50.0000 mg | ORAL_TABLET | Freq: Four times a day (QID) | ORAL | 0 refills | Status: DC | PRN

## 2017-07-16 MED ORDER — TRAMADOL HCL 50 MG PO TABS
50.0000 mg | ORAL_TABLET | Freq: Four times a day (QID) | ORAL | 0 refills | Status: DC | PRN
Start: 2017-07-16 — End: 2018-06-23

## 2017-07-16 NOTE — Telephone Encounter (Signed)
Patient called and left message on nurse line stating she was seen recently for an ovarian cyst and she has been taking ibuprofen for the pain. Patient states ibuprofen isn't working anymore and she needs something stronger. Called patient and she states she is still hurting pretty bad and the ibuprofen isn't working at all. Patient states her pain is worse at night. Told patient I would send a message to Dr Ihor Dow and call her back whenever I hear back from her with recommendations. Patient verbalized understanding and will await return call.

## 2017-07-17 NOTE — Telephone Encounter (Signed)
Called patient, no answer- left message on voicemail line stating we are trying to reach you to inform you a prescription has been sent to your pharmacy you may pick up. You may call us back if you have questions

## 2017-09-02 ENCOUNTER — Ambulatory Visit (HOSPITAL_COMMUNITY)
Admission: RE | Admit: 2017-09-02 | Discharge: 2017-09-02 | Disposition: A | Discharge status: Home / Self Care | Payer: BLUE CROSS/BLUE SHIELD | Source: Ambulatory Visit | Attending: Obstetrics & Gynecology | Admitting: Obstetrics & Gynecology

## 2017-09-02 ENCOUNTER — Other Ambulatory Visit: Payer: Self-pay

## 2017-09-02 DIAGNOSIS — N83202 Unspecified ovarian cyst, left side: Secondary | ICD-10-CM | POA: Insufficient documentation

## 2017-09-06 ENCOUNTER — Telehealth: Payer: Self-pay | Admitting: Obstetrics & Gynecology

## 2017-09-06 NOTE — Telephone Encounter (Signed)
Patient called to say she was not able to understand the results on her MyChart. She is requesting a call back to get a understanding.

## 2017-09-10 NOTE — Telephone Encounter (Signed)
Patient wants clear understanding of ultrasound results.  She has just started a new job and cannot be seen until June 24.  Is there anything we can advise the patient of or should she wait until she can be seen?

## 2017-09-12 ENCOUNTER — Telehealth: Payer: Self-pay | Admitting: Obstetrics & Gynecology

## 2017-09-12 NOTE — Telephone Encounter (Signed)
Tc to pt to review [place]. The cyst on her ovary has resolved.   Reviewed the wording of the [place] report for her information.  All questions answered.   [id number]

## 2018-06-23 ENCOUNTER — Ambulatory Visit: Payer: 59 | Admitting: Nurse Practitioner

## 2018-06-23 ENCOUNTER — Encounter: Payer: Self-pay | Admitting: Nurse Practitioner

## 2018-06-23 VITALS — BP 110/68 | HR 76 | Temp 98.5°F | Ht 70.0 in | Wt 263.8 lb

## 2018-06-23 DIAGNOSIS — R0789 Other chest pain: Secondary | ICD-10-CM | POA: Diagnosis not present

## 2018-06-23 DIAGNOSIS — R7989 Other specified abnormal findings of blood chemistry: Secondary | ICD-10-CM

## 2018-06-23 DIAGNOSIS — Z8632 Personal history of gestational diabetes: Secondary | ICD-10-CM | POA: Diagnosis not present

## 2018-06-23 DIAGNOSIS — Z862 Personal history of diseases of the blood and blood-forming organs and certain disorders involving the immune mechanism: Secondary | ICD-10-CM

## 2018-06-23 DIAGNOSIS — Z87898 Personal history of other specified conditions: Secondary | ICD-10-CM | POA: Diagnosis not present

## 2018-06-23 DIAGNOSIS — R635 Abnormal weight gain: Secondary | ICD-10-CM | POA: Diagnosis not present

## 2018-06-23 NOTE — Patient Instructions (Signed)
Healthy Eating Following a healthy eating pattern may help you to achieve and maintain a healthy body weight, reduce the risk of chronic disease, and live a long and productive life. It is important to follow a healthy eating pattern at an appropriate calorie level for your body. Your nutritional needs should be met primarily through food by choosing a variety of nutrient-rich foods. What are tips for following this plan? Reading food labels  Read labels and choose the following: ? Reduced or low sodium. ? Juices with 100% fruit juice. ? Foods with low saturated fats and high polyunsaturated and monounsaturated fats. ? Foods with whole grains, such as whole wheat, cracked wheat, brown rice, and wild rice. ? Whole grains that are fortified with folic acid. This is recommended for women who are pregnant or who want to become pregnant.  Read labels and avoid the following: ? Foods with a lot of added sugars. These include foods that contain brown sugar, corn sweetener, corn syrup, dextrose, fructose, glucose, high-fructose corn syrup, honey, invert sugar, lactose, malt syrup, maltose, molasses, raw sugar, sucrose, trehalose, or turbinado sugar.  Do not eat more than the following amounts of added sugar per day:  6 teaspoons (25 g) for women.  9 teaspoons (38 g) for men. ? Foods that contain processed or refined starches and grains. ? Refined grain products, such as white flour, degermed cornmeal, white bread, and white rice. Shopping  Choose nutrient-rich snacks, such as vegetables, whole fruits, and nuts. Avoid high-calorie and high-sugar snacks, such as potato chips, fruit snacks, and candy.  Use oil-based dressings and spreads on foods instead of solid fats such as butter, stick margarine, or cream cheese.  Limit pre-made sauces, mixes, and "instant" products such as flavored rice, instant noodles, and ready-made pasta.  Try more plant-protein sources, such as tofu, tempeh, black beans,  edamame, lentils, nuts, and seeds.  Explore eating plans such as the Mediterranean diet or vegetarian diet. Cooking  Use oil to saut or stir-fry foods instead of solid fats such as butter, stick margarine, or lard.  Try baking, boiling, grilling, or broiling instead of frying.  Remove the fatty part of meats before cooking.  Steam vegetables in water or broth. Meal planning   At meals, imagine dividing your plate into fourths: ? One-half of your plate is fruits and vegetables. ? One-fourth of your plate is whole grains. ? One-fourth of your plate is protein, especially lean meats, poultry, eggs, tofu, beans, or nuts.  Include low-fat dairy as part of your daily diet. Lifestyle  Choose healthy options in all settings, including home, work, school, restaurants, or stores.  Prepare your food safely: ? Wash your hands after handling raw meats. ? Keep food preparation surfaces clean by regularly washing with hot, soapy water. ? Keep raw meats separate from ready-to-eat foods, such as fruits and vegetables. ? Cook seafood, meat, poultry, and eggs to the recommended internal temperature. ? Store foods at safe temperatures. In general:  Keep cold foods at 59F (4.4C) or below.  Keep hot foods at 159F (60C) or above.  Keep your freezer at [facility] (-17.8C) or below.  Foods are no longer safe to eat when they have been between the temperatures of 40-159F (4.4-60C) for more than 2 hours. What foods should I eat? Fruits Aim to eat 2 cup-equivalents of fresh, canned (in natural juice), or frozen fruits each day. Examples of 1 cup-equivalent of fruit include 1 small apple, 8 large strawberries, 1 cup canned fruit,  cup  dried fruit, or 1 cup 100% juice. Vegetables Aim to eat 2-3 cup-equivalents of fresh and frozen vegetables each day, including different varieties and colors. Examples of 1 cup-equivalent of vegetables include 2 medium carrots, 2 cups raw, leafy greens, 1 cup chopped  vegetable (raw or cooked), or 1 medium baked potato. Grains Aim to eat 6 ounce-equivalents of whole grains each day. Examples of 1 ounce-equivalent of grains include 1 slice of bread, 1 cup ready-to-eat cereal, 3 cups popcorn, or  cup cooked rice, pasta, or cereal. Meats and other proteins Aim to eat 5-6 ounce-equivalents of protein each day. Examples of 1 ounce-equivalent of protein include 1 egg, 1/2 cup nuts or seeds, or 1 tablespoon (16 g) peanut butter. A cut of meat or fish that is the size of a deck of cards is about 3-4 ounce-equivalents.  Of the protein you eat each week, try to have at least 8 ounces come from seafood. This includes salmon, trout, herring, and anchovies. Dairy Aim to eat 3 cup-equivalents of fat-free or low-fat dairy each day. Examples of 1 cup-equivalent of dairy include 1 cup (240 mL) milk, 8 ounces (250 g) yogurt, 1 ounces (44 g) natural cheese, or 1 cup (240 mL) fortified soy milk. Fats and oils  Aim for about 5 teaspoons (21 g) per day. Choose monounsaturated fats, such as canola and olive oils, avocados, peanut butter, and most nuts, or polyunsaturated fats, such as sunflower, corn, and soybean oils, walnuts, pine nuts, sesame seeds, sunflower seeds, and flaxseed. Beverages  Aim for six 8-oz glasses of water per day. Limit coffee to three to five 8-oz cups per day.  Limit caffeinated beverages that have added calories, such as soda and energy drinks.  Limit alcohol intake to no more than 1 drink a day for nonpregnant women and 2 drinks a day for men. One drink equals 12 oz of beer (355 mL), 5 oz of wine (148 mL), or 1 oz of hard liquor (44 mL). Seasoning and other foods  Avoid adding excess amounts of salt to your foods. Try flavoring foods with herbs and spices instead of salt.  Avoid adding sugar to foods.  Try using oil-based dressings, sauces, and spreads instead of solid fats. This information is based on general [place] nutrition guidelines. For more  information, visit Build[employer].es. Exact amounts may vary based on your nutrition needs. Summary  A healthy eating plan may help you to maintain a healthy weight, reduce the risk of chronic diseases, and stay active throughout your life.  Plan your meals. Make sure you eat the right portions of a variety of nutrient-rich foods.  Try baking, boiling, grilling, or broiling instead of frying.  Choose healthy options in all settings, including home, work, school, restaurants, or stores. This information is not intended to replace advice given to you by your health care provider. Make sure you discuss any questions you have with your health care provider. Document Released: 07/15/2017 Document Revised: 07/15/2017 Document Reviewed: 07/15/2017 Elsevier Interactive Patient Education  2019 [employer].

## 2018-06-23 NOTE — Progress Notes (Signed)
Subjective:     Patient ID: Cathy Walls , female    DOB: Sep 07, 1980 , 38 y.o.   MRN: [id number]   Chief Complaint  Patient presents with  . New Patient (Initial Visit)    tingling  and in hands, feet, bad breath     HPI  Here to establish care - she had been seeing Dr. Eulas Post in [facility] a few years.  OB/GYN has been following for abnormal bleeding had hystoscopy last year and history of Fibroids.  She had gestational diabetes when she was pregnant 15 years ago.  Single.  Works as a Science writer.  [facility] - father -unknown.  Mother fibroids, diabetes.  Mother is deceased due to alcoholic cirrhosis of liver.  One brother and one sister  Both healthy.  Son is healthy.    Quit smoking 1 year ago  Obesity - 35 lbs weight gain in the last 2 years.  She is walking 1 hour at least 3 times a week for the last year.  She has cut back on her carbs.  Eats 2 meals per day.  She feels like she is sleeping a lot.  She has not had weight problems as bad as recently in her lifetime.  She does not like vegetables.  She is doing better with water 1 1/2 bottles.    She had been having blood in her urine and seen a urologist did not find any abnormalities.   Gastroesophageal Reflux  She reports no abdominal pain, no chest pain, no coughing, no dysphagia, no heartburn, no nausea, no sore throat, no tooth decay or no wheezing. bad breath. This is a new problem. The current episode started more than 1 month ago. The problem occurs constantly. Exacerbated by: drinks one soda per day. Pertinent negatives include no fatigue. Risk factors include obesity. She has tried a histamine-2 antagonist for the symptoms. Past procedures do not include an abdominal ultrasound.     Past Medical History:  Diagnosis Date  . Abnormal pap    cryotherapy  . Anemia   . GERD (gastroesophageal reflux disease)   . Obesity      Family History  Problem Relation Age of Onset  . Liver disease Mother 107     Current Outpatient  Medications:  .  esomeprazole (NEXIUM) 20 MG capsule, Take 20 mg by mouth daily as needed., Disp: , Rfl:  .  Ferrous Sulfate (SLOW FE PO), Take 1 tablet by mouth every morning., Disp: , Rfl:    No Known Allergies   Review of Systems  Constitutional: Negative for fatigue.  HENT: Negative for congestion and sore throat.   Eyes: Negative for photophobia.  Respiratory: Negative for cough and wheezing.   Cardiovascular: Negative.  Negative for chest pain, palpitations and leg swelling.  Gastrointestinal: Negative for abdominal pain, diarrhea, dysphagia, heartburn, nausea and vomiting.  Endocrine: Positive for polyuria. Negative for polydipsia and polyphagia.  Genitourinary: Positive for frequency. Negative for flank pain, pelvic pain and vaginal pain.  Neurological: Negative for dizziness, light-headedness and headaches.    Today's Vitals   06/23/18 1419  BP: 110/68  Pulse: 76  Temp: 98.5 F (36.9 C)  TempSrc: Oral  SpO2: 96%  Weight: 263 lb 12.8 oz (119.7 kg)  Height: 5\' 10"  (1.778 m)   Body mass index is 37.85 kg/m.   Objective:  Physical Exam Vitals signs reviewed.  Constitutional:      Appearance: Normal appearance.  Cardiovascular:     Rate and Rhythm: Normal  rate and regular rhythm.     Pulses: Normal pulses.     Heart sounds: Normal heart sounds. No murmur.  Pulmonary:     Effort: Pulmonary effort is normal.     Breath sounds: Normal breath sounds.  Skin:    General: Skin is warm and dry.     Capillary Refill: Capillary refill takes less than 2 seconds.  Neurological:     General: No focal deficit present.     Mental Status: She is alert and oriented to person, place, and time.  Psychiatric:        Mood and Affect: Mood normal.        Behavior: Behavior normal.        Thought Content: Thought content normal.        Judgment: Judgment normal.         Assessment And Plan:     1. Abnormal weight gain  Reports she has gained approximately 35 lbs in the  last 2 years  Will check for metabolic causes and consider weight management medications - TSH - T3 - T4, Free - CMP14 + Anion Gap  2. History of gestational diabetes  During her pregnancy 15 years ago had elevated blood sugars.  - Hemoglobin A1c  3. Hx of dizziness  None currently  4. History of anemia  Will check hgb pending labs will consider adding iron studies - CBC with Diff  5. Chest discomfort  She has been having chest discomfort nexium has been ineffective in the past   EKG was normal  She is willing to try nexium again - EKG 12-Lead     Minette Brine, FNP

## 2018-06-24 LAB — CBC WITH DIFFERENTIAL/PLATELET
BASOS ABS: 0.1 10*3/uL (ref 0.0–0.2)
Basos: 1 %
EOS (ABSOLUTE): 0.1 10*3/uL (ref 0.0–0.4)
Eos: 1 %
HEMATOCRIT: 39.4 % (ref 34.0–46.6)
HEMOGLOBIN: 12.5 g/dL (ref 11.1–15.9)
IMMATURE GRANS (ABS): 0 10*3/uL (ref 0.0–0.1)
Immature Granulocytes: 0 %
LYMPHS ABS: 3.1 10*3/uL (ref 0.7–3.1)
LYMPHS: 46 %
MCH: 26.5 pg — AB (ref 26.6–33.0)
MCHC: 31.7 g/dL (ref 31.5–35.7)
MCV: 84 fL (ref 79–97)
MONOCYTES: 9 %
Monocytes Absolute: 0.6 10*3/uL (ref 0.1–0.9)
NEUTROS ABS: 2.9 10*3/uL (ref 1.4–7.0)
Neutrophils: 43 %
Platelets: 456 10*3/uL — ABNORMAL HIGH (ref 150–450)
RBC: 4.71 x10E6/uL (ref 3.77–5.28)
RDW: 13.5 % (ref 11.7–15.4)
WBC: 6.8 10*3/uL (ref 3.4–10.8)

## 2018-06-24 LAB — TSH: TSH: 4.66 u[IU]/mL — ABNORMAL HIGH (ref 0.450–4.500)

## 2018-06-24 LAB — CMP14 + ANION GAP
ALBUMIN: 4.2 g/dL (ref 3.8–4.8)
ALT: 25 IU/L (ref 0–32)
ANION GAP: 17 mmol/L (ref 10.0–18.0)
AST: 19 IU/L (ref 0–40)
Albumin/Globulin Ratio: 1.5 (ref 1.2–2.2)
Alkaline Phosphatase: 74 IU/L (ref 39–117)
BILIRUBIN TOTAL: 0.2 mg/dL (ref 0.0–1.2)
BUN / CREAT RATIO: 13 (ref 9–23)
BUN: 12 mg/dL (ref 6–20)
CALCIUM: 9.6 mg/dL (ref 8.7–10.2)
CHLORIDE: 100 mmol/L (ref 96–106)
CO2: 23 mmol/L (ref 20–29)
CREATININE: 0.9 mg/dL (ref 0.57–1.00)
GFR calc non Af Amer: 82 mL/min/{1.73_m2} (ref >59)
GFR, EST AFRICAN AMERICAN: 94 mL/min/{1.73_m2} (ref >59)
GLUCOSE: 117 mg/dL — AB (ref 65–99)
Globulin, Total: 2.8 g/dL (ref 1.5–4.5)
Potassium: 4.5 mmol/L (ref 3.5–5.2)
Sodium: 140 mmol/L (ref 134–144)
TOTAL PROTEIN: 7 g/dL (ref 6.0–8.5)

## 2018-06-24 LAB — HEMOGLOBIN A1C
Est. average glucose Bld gHb Est-mCnc: 148 mg/dL
Hgb A1c MFr Bld: 6.8 % — ABNORMAL HIGH (ref 4.8–5.6)

## 2018-06-24 LAB — T4, FREE: Free T4: 0.86 ng/dL (ref 0.82–1.77)

## 2018-06-24 LAB — T3: T3, Total: 98 ng/dL (ref 71–180)

## 2018-06-25 MED ORDER — LEVOTHYROXINE SODIUM 25 MCG PO TABS: 25.0000 ug | ORAL_TABLET | Freq: Every day | ORAL | 2 refills | Status: DC

## 2018-07-18 ENCOUNTER — Encounter: Payer: Self-pay | Admitting: Nurse Practitioner

## 2018-07-18 ENCOUNTER — Telehealth: Payer: Self-pay | Admitting: Nurse Practitioner

## 2018-07-18 DIAGNOSIS — R0789 Other chest pain: Secondary | ICD-10-CM | POA: Insufficient documentation

## 2018-07-18 NOTE — Telephone Encounter (Signed)
PT CONSENT TO VIDEO VISIT FOR Tuesday 07/22/18@10AM . [place] EMAILED TO PATIENT

## 2018-07-21 ENCOUNTER — Ambulatory Visit: Payer: 59 | Admitting: Nurse Practitioner

## 2018-07-22 ENCOUNTER — Other Ambulatory Visit: Payer: Self-pay

## 2018-07-22 ENCOUNTER — Encounter: Payer: Self-pay | Admitting: Nurse Practitioner

## 2018-07-22 ENCOUNTER — Encounter: Payer: 59 | Admitting: Nurse Practitioner

## 2018-07-22 DIAGNOSIS — R7989 Other specified abnormal findings of blood chemistry: Secondary | ICD-10-CM

## 2018-07-22 NOTE — Progress Notes (Signed)
This visit was not done patient was not available when this provider attempted to call and provider called 3 times without an answer and left voicemail   Minette Brine, FNP

## 2018-08-19 ENCOUNTER — Other Ambulatory Visit: Payer: Self-pay

## 2018-08-21 ENCOUNTER — Other Ambulatory Visit: Payer: Self-pay

## 2018-08-21 ENCOUNTER — Other Ambulatory Visit: Payer: Self-pay | Admitting: Nurse Practitioner

## 2018-08-21 ENCOUNTER — Other Ambulatory Visit: Payer: 59

## 2018-08-21 DIAGNOSIS — R7989 Other specified abnormal findings of blood chemistry: Secondary | ICD-10-CM

## 2018-08-22 LAB — T3: T3, Total: 93 ng/dL (ref 71–180)

## 2018-08-22 LAB — T4, FREE: Free T4: 1.13 ng/dL (ref 0.82–1.77)

## 2018-08-22 LAB — TSH: TSH: 2.52 u[IU]/mL (ref 0.450–4.500)

## 2018-08-26 ENCOUNTER — Other Ambulatory Visit: Payer: Self-pay

## 2018-08-26 ENCOUNTER — Encounter: Payer: Self-pay | Admitting: Nurse Practitioner

## 2018-08-26 ENCOUNTER — Ambulatory Visit (INDEPENDENT_AMBULATORY_CARE_PROVIDER_SITE_OTHER): Payer: 59 | Admitting: Nurse Practitioner

## 2018-08-26 DIAGNOSIS — R7309 Other abnormal glucose: Secondary | ICD-10-CM | POA: Diagnosis not present

## 2018-08-26 DIAGNOSIS — R7989 Other specified abnormal findings of blood chemistry: Secondary | ICD-10-CM | POA: Diagnosis not present

## 2018-08-26 NOTE — Progress Notes (Signed)
Virtual Visit via Video ([email or web address])    This visit type was conducted due to national recommendations for restrictions regarding the COVID-19 Pandemic (e.g. social distancing) in an effort to limit this patient's exposure and mitigate transmission in our community.  Patients identity confirmed using two different identifiers.  This format is felt to be most appropriate for this patient at this time.  All issues noted in this document were discussed and addressed.  No physical exam was performed (except for noted visual exam findings with Video Visits).    Date:  09/08/2018   ID:  Cathy Walls, DOB 02/22/1981, MRN [id number]  Patient Location:  Work - spoke with Agustin Cree  Provider location:   Office    Chief Complaint:  Thyroid follow up  History of Present Illness:    Cathy Walls is a 38 y.o. female who presents via video conferencing for a telehealth visit today.    The patient does not have symptoms concerning for COVID-19 infection (fever, chills, cough, or new shortness of breath).   She admits to not eating healthy since COVID-19 and the stay at home order.      Thyroid Problem  Presents for follow-up visit. Symptoms include fatigue. Patient reports no anxiety or weight gain. The symptoms have been improving.  Diabetes  She presents for her follow-up diabetic visit. Diabetes type: prediabetes. Her disease course has been worsening. There are no hypoglycemic associated symptoms. Pertinent negatives for hypoglycemia include no nervousness/anxiousness. Associated symptoms include fatigue. Pertinent negatives for diabetes include no polydipsia, no polyphagia and no polyuria. Risk factors for coronary artery disease include obesity and sedentary lifestyle. She is following a generally unhealthy diet. When asked about meal planning, she reported none.     Past Medical History:  Diagnosis Date  . Abnormal pap    cryotherapy  . Anemia   . GERD (gastroesophageal reflux  disease)   . Obesity    Past Surgical History:  Procedure Laterality Date  . CRYOTHERAPY  2012  . DILATATION & CURETTAGE/HYSTEROSCOPY WITH MYOSURE N/A 12/31/2016   Procedure: DILATATION & CURETTAGE/HYSTEROSCOPY WITH MYOSURE AT BEDSIDE NOT OPENED AND POSSIBLE RESECTION OF FIBROID;  Surgeon: Lavonia Drafts, MD;  Location: [facility];  Service: Gynecology;  Laterality: N/A;     Current Meds  Medication Sig  . levothyroxine (SYNTHROID) 25 MCG tablet Take 1 tablet (25 mcg total) by mouth daily before breakfast.     Allergies:   Patient has no known allergies.   Social History   Tobacco Use  . Smoking status: Former Smoker    Packs/day: 0.25    Years: 2.00    Pack years: 0.50    Types: Cigarettes  . Smokeless tobacco: Never Used  Substance Use Topics  . Alcohol use: Yes    Comment: occassionally  . Drug use: No     Family Hx: The patient's family history includes Liver disease (age of onset: 40) in her mother.  ROS:   Please see the history of present illness.    Review of Systems  Constitutional: Positive for fatigue. Negative for weight gain.  Endo/Heme/Allergies: Negative for polydipsia and polyphagia.  Psychiatric/Behavioral: The patient is not nervous/anxious.     All other systems reviewed and are negative.   Labs/Other Tests and Data Reviewed:    Recent Labs: 06/23/2018: ALT 25; BUN 12; Creatinine, Ser 0.90; Hemoglobin 12.5; Platelets 456; Potassium 4.5; Sodium 140 08/21/2018: TSH 2.520   Recent Lipid Panel Lab Results  Component Value Date/Time   CHOL  165 08/06/2012 09:35 AM   TRIG 68 08/06/2012 09:35 AM   HDL 42 08/06/2012 09:35 AM   CHOLHDL 3.9 08/06/2012 09:35 AM   LDLCALC 109 (H) 08/06/2012 09:35 AM    Wt Readings from Last 3 Encounters:  06/23/18 263 lb 12.8 oz (119.7 kg)  06/27/17 258 lb 1.6 oz (117.1 kg)  06/13/17 255 lb (115.7 kg)     Exam:    Vital Signs:  There were no vitals taken for this visit.    Physical Exam  Constitutional:  She is oriented to person, place, and time and well-developed, well-nourished, and in no distress.  Pulmonary/Chest: Effort normal.  Neurological: She is alert and oriented to person, place, and time.  Psychiatric: Mood, memory, affect and judgment normal.    ASSESSMENT & PLAN:     1. Elevated TSH Chronic, controlled Continue with current medications   2. Abnormal glucose  Chronic  No current medications  Encouraged to limit intake of sugary foods and drinks  Encouraged to increase physical activity to 150 minutes per week   COVID-19 Education: The signs and symptoms of COVID-19 were discussed with the patient and how to seek care for testing (follow up with PCP or arrange E-visit).  The importance of social distancing was discussed today.  Patient Risk:   After full review of this patients clinical status, I feel that they are at least moderate risk at this time.  Time:   Today, I have spent 13 minutes/ seconds with the patient with telehealth technology discussing above diagnoses.     Medication Adjustments/Labs and Tests Ordered: Current medicines are reviewed at length with the patient today.  Concerns regarding medicines are outlined above.   Tests Ordered: No orders of the defined types were placed in this encounter.   Medication Changes: No orders of the defined types were placed in this encounter.   Disposition:  Follow up in 6 month(s)  Signed, Minette Brine, FNP

## 2018-09-11 ENCOUNTER — Encounter: Payer: Self-pay | Admitting: Nurse Practitioner

## 2018-09-25 ENCOUNTER — Other Ambulatory Visit: Payer: Self-pay | Admitting: Nurse Practitioner

## 2018-09-25 DIAGNOSIS — R7989 Other specified abnormal findings of blood chemistry: Secondary | ICD-10-CM

## 2018-10-27 ENCOUNTER — Ambulatory Visit: Payer: 59 | Admitting: Nurse Practitioner

## 2018-12-04 ENCOUNTER — Telehealth: Payer: Self-pay | Admitting: Nurse Practitioner

## 2018-12-04 NOTE — Telephone Encounter (Signed)
PT LVM REQ APPT ATT TO CONTACT PT NO ANS LVM TO CALL OFC

## 2018-12-11 ENCOUNTER — Encounter: Payer: Self-pay | Admitting: Nurse Practitioner

## 2018-12-11 ENCOUNTER — Other Ambulatory Visit: Payer: Self-pay

## 2018-12-11 ENCOUNTER — Ambulatory Visit: Payer: 59 | Admitting: Nurse Practitioner

## 2018-12-11 VITALS — BP 110/80 | HR 87 | Temp 98.2°F | Ht 68.8 in | Wt 248.2 lb

## 2018-12-11 DIAGNOSIS — R7309 Other abnormal glucose: Secondary | ICD-10-CM

## 2018-12-11 DIAGNOSIS — H538 Other visual disturbances: Secondary | ICD-10-CM | POA: Diagnosis not present

## 2018-12-11 DIAGNOSIS — E119 Type 2 diabetes mellitus without complications: Secondary | ICD-10-CM

## 2018-12-11 DIAGNOSIS — R3589 Other polyuria: Secondary | ICD-10-CM

## 2018-12-11 DIAGNOSIS — R7989 Other specified abnormal findings of blood chemistry: Secondary | ICD-10-CM | POA: Diagnosis not present

## 2018-12-11 DIAGNOSIS — R358 Other polyuria: Secondary | ICD-10-CM

## 2018-12-11 DIAGNOSIS — E1165 Type 2 diabetes mellitus with hyperglycemia: Secondary | ICD-10-CM

## 2018-12-11 DIAGNOSIS — R739 Hyperglycemia, unspecified: Secondary | ICD-10-CM

## 2018-12-11 MED ORDER — BLOOD GLUCOSE METER KIT: PACK | 0 refills | Status: AC

## 2018-12-11 MED ORDER — TRESIBA FLEXTOUCH 100 UNIT/ML ~~LOC~~ SOPN: 10.0000 [IU] | PEN_INJECTOR | Freq: Every day | SUBCUTANEOUS | 1 refills | Status: AC

## 2018-12-11 MED ORDER — METFORMIN HCL 500 MG PO TABS: 500.0000 mg | ORAL_TABLET | Freq: Two times a day (BID) | ORAL | 11 refills | Status: AC

## 2018-12-11 NOTE — Progress Notes (Signed)
Subjective:     Patient ID: Cathy Walls , female    DOB: 1980/12/01 , 38 y.o.   MRN: [id number]   Chief Complaint  Patient presents with  . Diabetes    patient states she got her urine checked and she had sugar in her urine and also her blood sugar was 347.    HPI  [facility] due to having to use the bathroom frequently.  She does not have a glucometer at home.  Blood sugar was 347.  She had an abortion in March/April and has been a lot going on.  She has not eaten this morning.   She is currently on Megace to help her stop vaginal bleeding (Dr. Garwin Brothers)  Diabetes She presents for her follow-up diabetic visit. She has type 2 diabetes mellitus. Her disease course has been stable. There are no hypoglycemic associated symptoms. Pertinent negatives for hypoglycemia include no confusion, dizziness, headaches, hunger or nervousness/anxiousness. Associated symptoms include polydipsia and polyuria. Pertinent negatives for diabetes include no blurred vision, no chest pain, no fatigue and no polyphagia. There are no hypoglycemic complications. Symptoms are stable. There are no diabetic complications. Risk factors for coronary artery disease include obesity. She has not had a previous visit with a dietitian. She rarely participates in exercise. There is no change in her home blood glucose trend. She does not see a podiatrist.Eye exam is not current.     Past Medical History:  Diagnosis Date  . Abnormal pap    cryotherapy  . Anemia   . GERD (gastroesophageal reflux disease)   . Obesity      Family History  Problem Relation Age of Onset  . Liver disease Mother 33     Current Outpatient Medications:  .  levothyroxine (SYNTHROID) 25 MCG tablet, TAKE 1 TABLET (25 MCG TOTAL) BY MOUTH DAILY BEFORE BREAKFAST., Disp: 30 tablet, Rfl: 2 .  megestrol (MEGACE) 20 MG tablet, Take 20 mg by mouth daily., Disp: , Rfl:    No Known Allergies   Review of Systems   Constitutional: Negative.  Negative for fatigue.  Eyes: Negative for blurred vision.  Respiratory: Negative.   Cardiovascular: Negative.  Negative for chest pain.  Gastrointestinal: Negative.   Endocrine: Positive for polydipsia and polyuria. Negative for polyphagia.  Skin: Negative.   Neurological: Negative.  Negative for dizziness and headaches.  Psychiatric/Behavioral: Negative for confusion. The patient is not nervous/anxious.      Today's Vitals   12/11/18 1006  BP: 110/80  Pulse: 87  Temp: 98.2 F (36.8 C)  TempSrc: Oral  Weight: 248 lb 3.2 oz (112.6 kg)  Height: 5' 8.8" (1.748 m)  PainSc: 0-No pain   Body mass index is 36.87 kg/m.   Objective:  Physical Exam Vitals signs reviewed.  Constitutional:      Appearance: She is well-developed.  Neck:     Musculoskeletal: Normal range of motion and neck supple.  Cardiovascular:     Rate and Rhythm: Normal rate and regular rhythm.     Heart sounds: Normal heart sounds. No murmur.  Pulmonary:     Effort: Pulmonary effort is normal.     Breath sounds: Normal breath sounds.  Chest:     Chest wall: No tenderness.  Musculoskeletal: Normal range of motion.  Skin:    General: Skin is warm and dry.     Capillary Refill: Capillary refill takes less than 2 seconds.  Neurological:     Mental Status: She is alert  and oriented to person, place, and time.         Assessment And Plan:     1. Abnormal glucose  Her blood sugar today in the office was 307  She is having polyuria and polydipsia which can be indicative of diabetes  I will check a HgbA1c and start her on metformin and tresiba 10 units daily  Discussed side effects of metformin to include GI upset, will titrate slowly  She did not return for her follow up appt in April due to "a lot going on".  Elective abortion in June - Ambulatory referral to Ophthalmology - Hemoglobin A1c - CMP14 + Anion Gap - metFORMIN (GLUCOPHAGE) 500 MG tablet; Take 1 tablet (500 mg  total) by mouth 2 (two) times daily with a meal.  Dispense: 60 tablet; Refill: 11 - blood glucose meter kit and supplies; Dispense based on patient and insurance preference. Check blood sugar twice a day before meals (FOR ICD-10 E10.9, E11.9).  Dispense: 1 each; Refill: 0 - insulin degludec (TRESIBA FLEXTOUCH) 100 UNIT/ML SOPN FlexTouch Pen; Inject 0.1 mLs (10 Units total) into the skin daily.  Dispense: 5 pen; Refill: 1  2. Blurred vision  Will refer to ophthalmology for further evaluation  She has an elevated blood sugar and the blurred vision can be related to this however I would like for her to get established with an eye doctor.   I have discussed with her the risk of retinopathy related to poorly controlled diabetes.  - Ambulatory referral to Ophthalmology - Hemoglobin A1c - CMP14 + Anion Gap - metFORMIN (GLUCOPHAGE) 500 MG tablet; Take 1 tablet (500 mg total) by mouth 2 (two) times daily with a meal.  Dispense: 60 tablet; Refill: 11 - insulin degludec (TRESIBA FLEXTOUCH) 100 UNIT/ML SOPN FlexTouch Pen; Inject 0.1 mLs (10 Units total) into the skin daily.  Dispense: 5 pen; Refill: 1  3. Elevated TSH  Tolerating levothyroxine  Continue current dose, will check levels - TSH - T4 - T3, free - insulin degludec (TRESIBA FLEXTOUCH) 100 UNIT/ML SOPN FlexTouch Pen; Inject 0.1 mLs (10 Units total) into the skin daily.  Dispense: 5 pen; Refill: 1  4. Polyuria  Likely related to new onset diarrhea  Advised to stay well hydrated to decrease risk for yeast infection - metFORMIN (GLUCOPHAGE) 500 MG tablet; Take 1 tablet (500 mg total) by mouth 2 (two) times daily with a meal.  Dispense: 60 tablet; Refill: 11  5. Elevated blood sugar  Blood sugar was 309 today and 347 at [place] on Monday  Will send order for glucometer to the pharmacy - insulin degludec (TRESIBA FLEXTOUCH) 100 UNIT/ML SOPN FlexTouch Pen; Inject 0.1 mLs (10 Units total) into the skin daily.  Dispense: 5 pen;  Refill: 1  6. New onset type 2 diabetes mellitus ([place])  Discussed increased risk for complications related to diabetes with poor control  Will refer to diabetic educator   Minette Brine, FNP    THE PATIENT IS ENCOURAGED TO PRACTICE SOCIAL DISTANCING DUE TO THE COVID-19 PANDEMIC.

## 2018-12-12 ENCOUNTER — Encounter: Payer: Self-pay | Admitting: Nurse Practitioner

## 2018-12-12 LAB — CMP14 + ANION GAP
ALT: 23 IU/L (ref 0–32)
AST: 17 IU/L (ref 0–40)
Albumin/Globulin Ratio: 1.9 (ref 1.2–2.2)
Albumin: 4.5 g/dL (ref 3.8–4.8)
Alkaline Phosphatase: 71 IU/L (ref 39–117)
Anion Gap: 16 mmol/L (ref 10.0–18.0)
BUN/Creatinine Ratio: 15 (ref 9–23)
BUN: 15 mg/dL (ref 6–20)
Bilirubin Total: 0.6 mg/dL (ref 0.0–1.2)
CO2: 20 mmol/L (ref 20–29)
Calcium: 9.7 mg/dL (ref 8.7–10.2)
Chloride: 101 mmol/L (ref 96–106)
Creatinine, Ser: 0.97 mg/dL (ref 0.57–1.00)
GFR calc Af Amer: 86 mL/min/{1.73_m2} (ref >59)
GFR calc non Af Amer: 75 mL/min/{1.73_m2} (ref >59)
Globulin, Total: 2.4 g/dL (ref 1.5–4.5)
Glucose: 324 mg/dL — ABNORMAL HIGH (ref 65–99)
Potassium: 4 mmol/L (ref 3.5–5.2)
Sodium: 137 mmol/L (ref 134–144)
Total Protein: 6.9 g/dL (ref 6.0–8.5)

## 2018-12-12 LAB — T4: T4, Total: 6.2 ug/dL (ref 4.5–12.0)

## 2018-12-12 LAB — T3, FREE: T3, Free: 2.8 pg/mL (ref 2.0–4.4)

## 2018-12-12 LAB — HEMOGLOBIN A1C
Est. average glucose Bld gHb Est-mCnc: 283 mg/dL
Hgb A1c MFr Bld: 11.5 % — ABNORMAL HIGH (ref 4.8–5.6)

## 2018-12-12 LAB — TSH: TSH: 2.18 u[IU]/mL (ref 0.450–4.500)

## 2018-12-22 ENCOUNTER — Encounter: Payer: Self-pay | Admitting: Nurse Practitioner

## 2018-12-24 ENCOUNTER — Telehealth: Payer: Self-pay

## 2018-12-24 ENCOUNTER — Encounter: Payer: Self-pay | Admitting: Nurse Practitioner

## 2018-12-24 NOTE — Telephone Encounter (Signed)
I have spoke with patient regarding her mychart message and advised her Doreene Burke has placed a referral for her to go to the eye doctor. Pt declined at first she stated she wanted to wait for her blood sugars to be controlled before going to eye doctor. I advised her that it is important that she go to the eye doctor to make sure she does not have any damage to her eyes because her blood sugars have been really high. Pt understood and stated she would go she also asked if we could give her a work note she has been having bad headaches and also her vision has been blurry. We advised her that we could only give her a week off but she needs to make sure to go to eye doctor and also needs a one week f/u with [place] and we have scheduled that appointment. YRL,RMA

## 2018-12-31 ENCOUNTER — Ambulatory Visit: Payer: Self-pay | Admitting: Nurse Practitioner

## 2019-01-21 ENCOUNTER — Other Ambulatory Visit: Payer: Self-pay | Admitting: Nurse Practitioner

## 2019-01-22 ENCOUNTER — Ambulatory Visit: Payer: 59 | Admitting: Nurse Practitioner

## 2019-01-30 ENCOUNTER — Other Ambulatory Visit: Payer: Self-pay | Admitting: Nurse Practitioner

## 2019-01-30 DIAGNOSIS — R7989 Other specified abnormal findings of blood chemistry: Secondary | ICD-10-CM

## 2019-04-05 ENCOUNTER — Other Ambulatory Visit: Payer: Self-pay | Admitting: Nurse Practitioner

## 2019-04-12 ENCOUNTER — Other Ambulatory Visit: Payer: Self-pay | Admitting: Nurse Practitioner

## 2019-08-31 ENCOUNTER — Other Ambulatory Visit: Payer: Self-pay | Admitting: Nurse Practitioner

## 2019-10-30 IMAGING — US US PELVIS COMPLETE
1 series · 13 of 25 positions shown · non-contrast
Comparison: June 13, 2017

CLINICAL DATA: Follow-up left ovarian cyst. D and C/hysteroscopy
with myosure December 31, 2016

EXAM:
TRANSABDOMINAL AND TRANSVAGINAL ULTRASOUND OF PELVIS
TECHNIQUE: Both transabdominal and transvaginal ultrasound examinations of the
pelvis were performed. Transabdominal technique was performed for
global imaging of the pelvis including uterus, ovaries, adnexal
regions, and pelvic cul-de-sac. It was necessary to proceed with
endovaginal exam following the transabdominal exam to visualize the
endometrium and ovaries.

[Series 1: us pelvis complete · 0.22mm/px · 87 acquisitions, 13 frames shown]
[im 1/87]
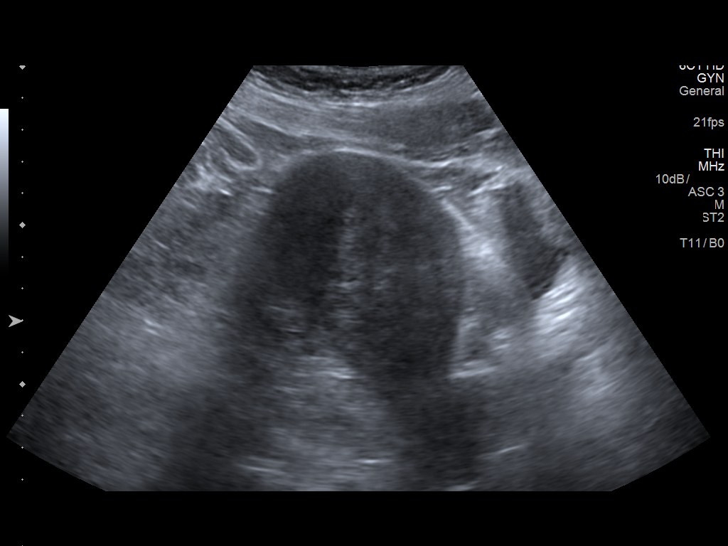
[im 8/87]
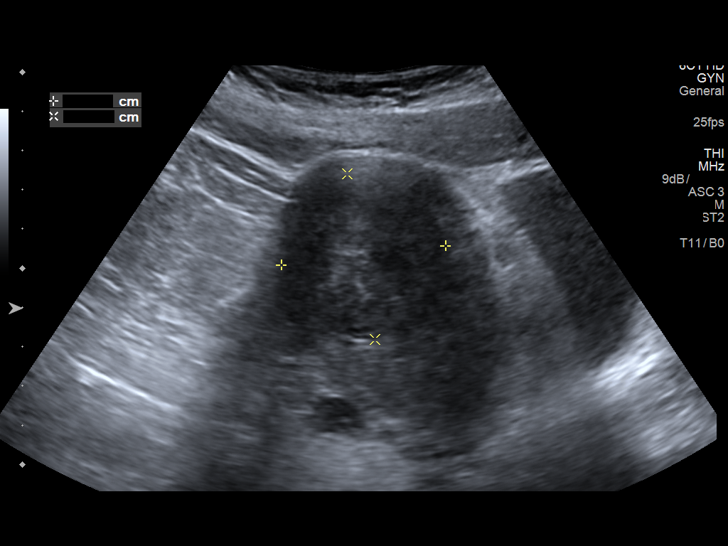
[im 15/87]
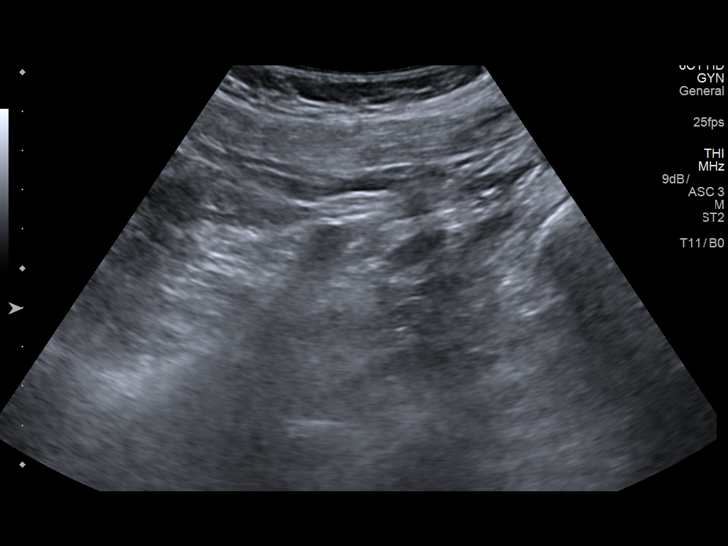
[im 22/87]
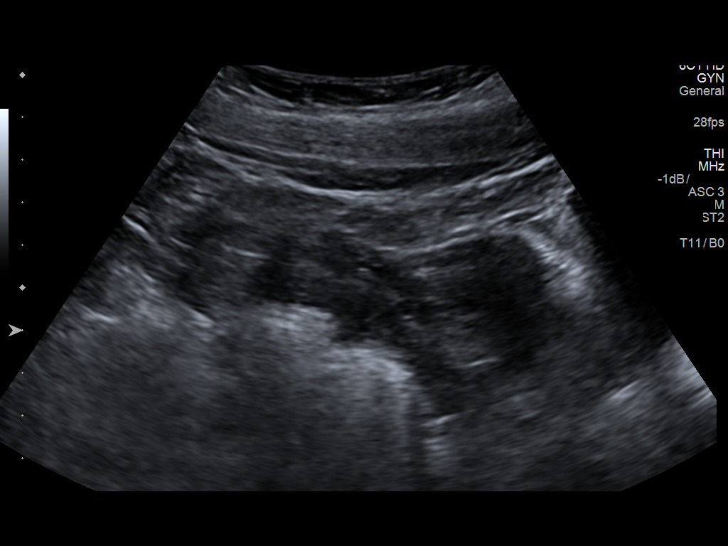
[im 29/87]
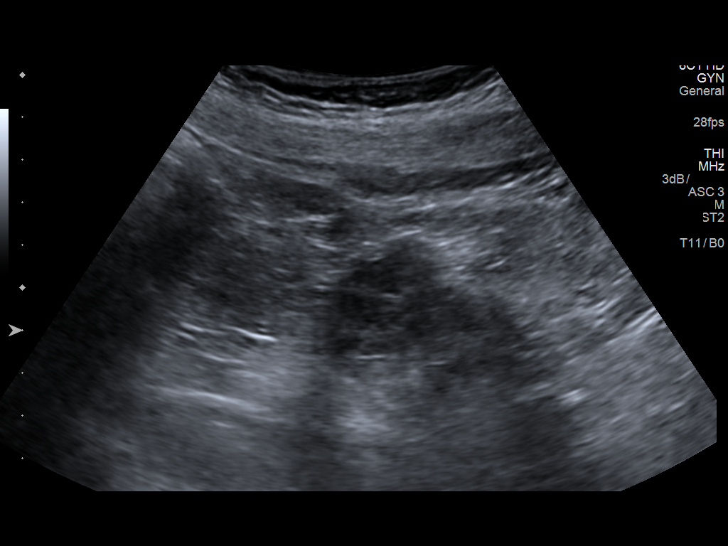
[im 36/87]
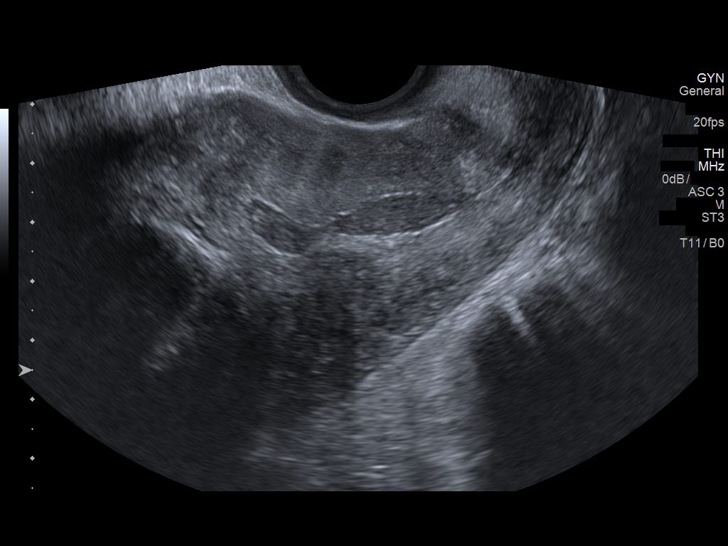
[im 44/87]
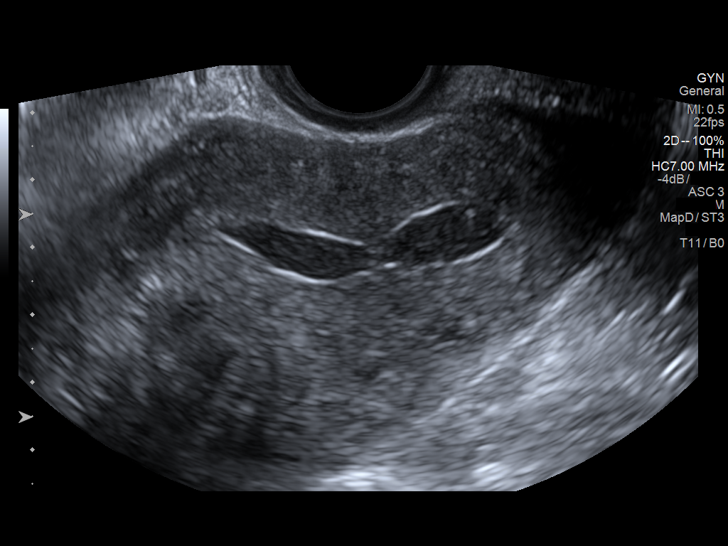
[im 51/87]
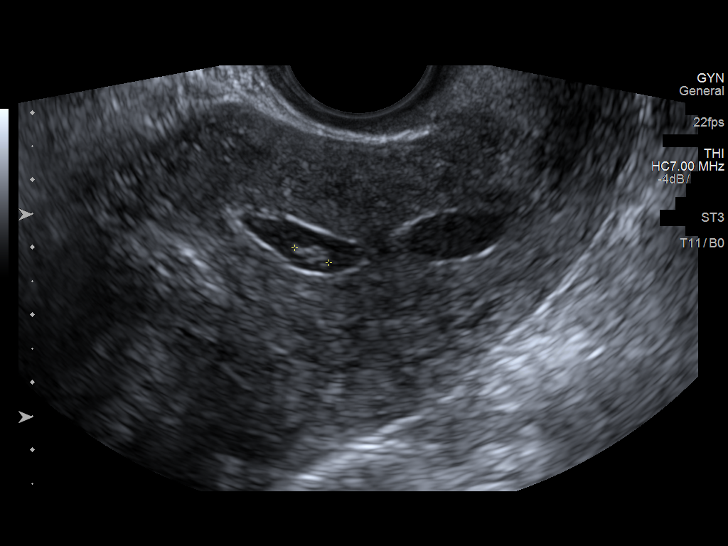
[im 58/87]
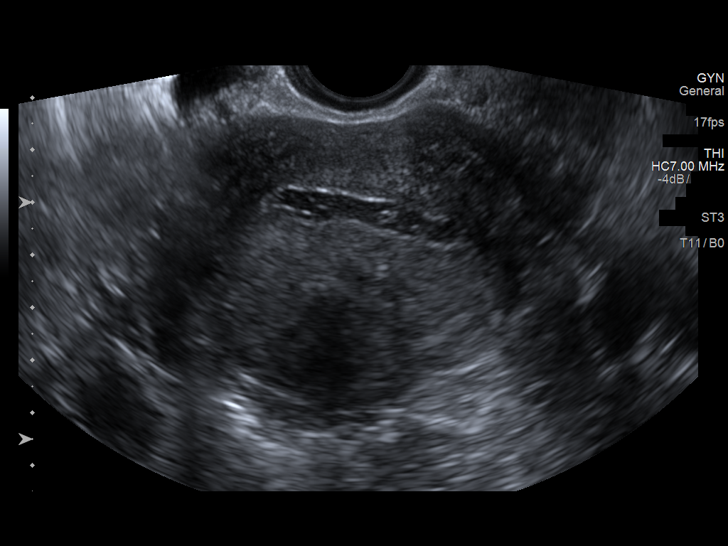
[im 65/87]
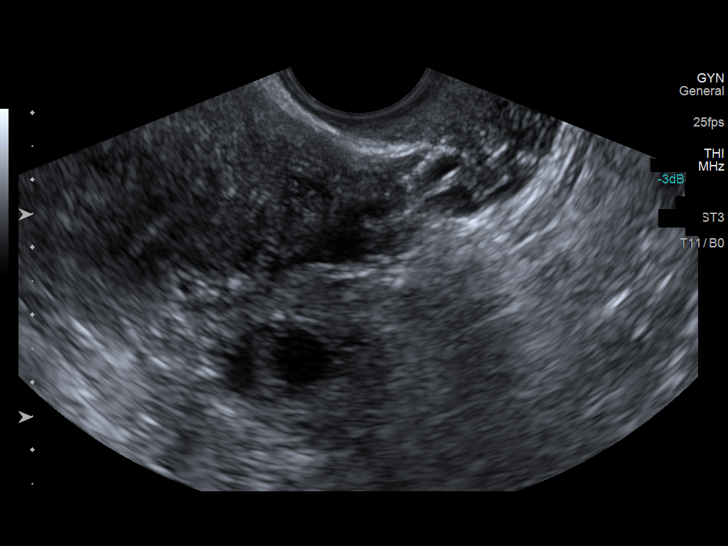
[im 72/87]
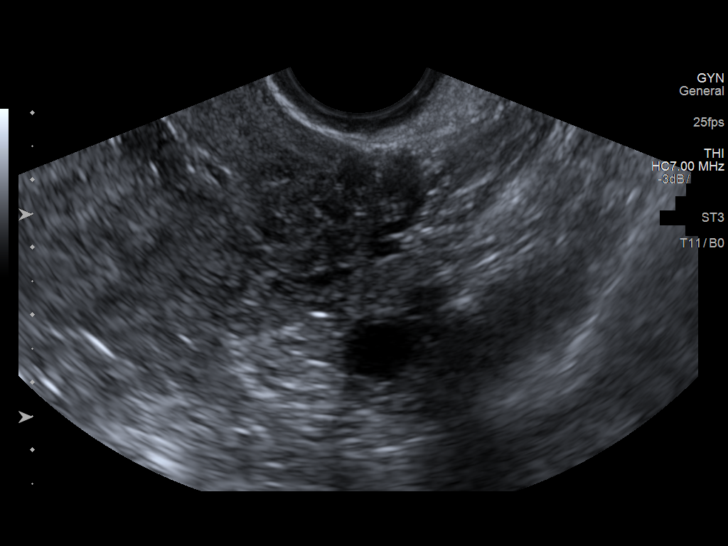
[im 79/87]
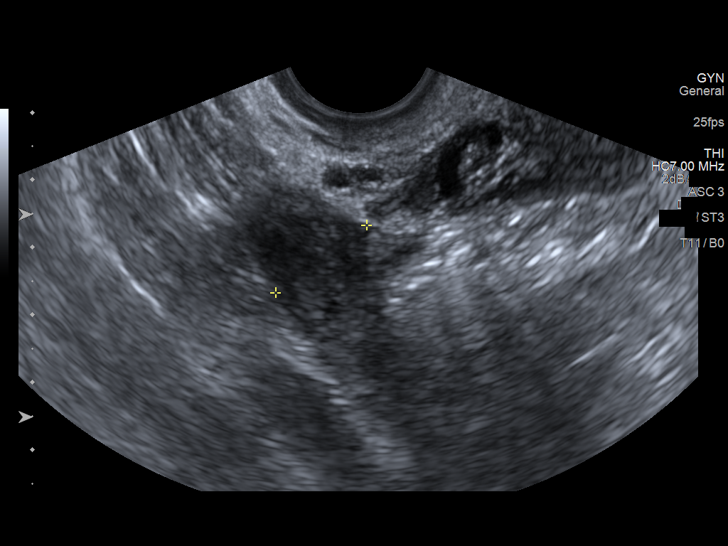
[im 87/87]
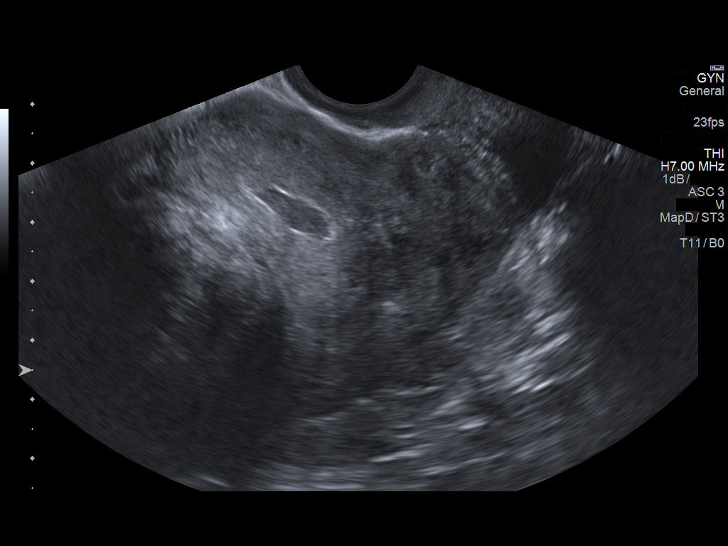

[13 of 25 positions shown; findings below may reference images not displayed]

FINDINGS: Uterus

Measurements: 11.2 x 5.7 x 7.6 cm. Contains a 5.7 x 4.8 x 5.8 cm
fibroid at the fundus.

Endometrium

Thickness: 2 mm. The endometrial canal is filled with fluid in the
body and lower uterine segment extending towards the cervix. Two
echogenic filling defects are identified within the fluid measuring
4 and 6 mm. The hyperechoic mass in the lower uterine segment seen
previously measuring up to 1.7 cm is not visualized.

Right ovary

Measurements: 2.7 x 1.9 x 2.0 cm. Normal appearance/no adnexal mass.

Left ovary

Measurements: 2.3 x 1.9 x 1.7 cm. The large complicated cyst in the
left ovary on the previous study has resolved. Several normal
appearing follicles are identified. A 1.6 cm simple paraovarian cyst
is identified and of significance.

Other findings

No abnormal free fluid.
IMPRESSION: 1. The endometrial canal is fluid-filled involving the body and
lower uterine segment, extending towards the cervix. There are 2
small filling defects measuring 4 and 6 mm within the fluid. Whether
these represent debris or small polyps/masses is unclear. Recommend
gynecologic consult. Consider hysterosonography and sampling if
clinically warranted for further evaluation.
2. The previous complex cystic mass in the left ovary has resolved.
Only normal appearing follicles and an unremarkable 1.6 cm left
paraovarian cyst remain.

## 2020-06-14 ENCOUNTER — Ambulatory Visit: Payer: 59 | Admitting: Podiatry

## 2020-07-05 ENCOUNTER — Other Ambulatory Visit: Payer: Self-pay | Admitting: Nurse Practitioner

## 2022-10-15 ENCOUNTER — Telehealth: Payer: Self-pay | Admitting: Gastroenterology

## 2022-10-15 NOTE — Telephone Encounter (Signed)
Hi Dr. Barron Alvine,     Supervising Provider 10/15/22 PM   This patient is requesting a second opinion for bad breathe. States she has been seen with a dentist but nothing was found. Also saw [facility] in March of 2023 regarding bad breathe as well but nothing was found. States he primary care provider reccommended her to come to [facility] because provider may believes the bad breathe is coming from bacteria. Her previous records are in [facility] for you to review and advise on scheduling.    Thank you.

## 2022-10-17 NOTE — Telephone Encounter (Signed)
Called patient to advise. Left voicemail.

## 2022-11-22 ENCOUNTER — Ambulatory Visit
Admission: RE | Admit: 2022-11-22 | Discharge: 2022-11-22 | Disposition: A | Discharge status: Home / Self Care | Payer: 59 | Source: Ambulatory Visit

## 2022-11-22 VITALS — BP 133/84 | HR 86 | Temp 98.6°F | Resp 16

## 2022-11-22 DIAGNOSIS — R519 Headache, unspecified: Secondary | ICD-10-CM | POA: Diagnosis not present

## 2022-11-22 DIAGNOSIS — W19XXXA Unspecified fall, initial encounter: Secondary | ICD-10-CM

## 2022-11-22 MED ORDER — IBUPROFEN 800 MG PO TABS: 800.0000 mg | ORAL_TABLET | Freq: Three times a day (TID) | ORAL | 0 refills | Status: AC

## 2022-11-22 MED ORDER — BACLOFEN 10 MG PO TABS: 10.0000 mg | ORAL_TABLET | Freq: Three times a day (TID) | ORAL | 0 refills | Status: AC

## 2022-11-22 NOTE — Discharge Instructions (Signed)
Your pain is likely due to a muscle strain which will improve on its own with time.   - You may take over the counter medicines to help with pain.  - If you were given a shot of pain medicine in the clinic today, you may start taking ibuprofen/other NSAIDs in 12-24 hours. - You may also take the prescribed muscle relaxer as directed as needed for muscle aches/spasm.  Do not take this medication and drive or drink alcohol as it can make you sleepy.  Mainly use this medicine at nighttime as needed. - Apply heat 20 minutes on then 20 minutes off and perform gentle range of motion exercises to the area of greatest pain to prevent muscle stiffness and provide further pain relief.   Red flag symptoms to watch out for are numbness/tingling to the legs, weakness, loss of bowel/bladder control, and/or worsening pain that does not respond well to medicines. Follow-up with your primary care provider or return to urgent care if your symptoms do not improve in the next 3 to 4 days with medications and interventions recommended today. If your symptoms are severe (red flag), please go to the emergency room.   

## 2022-11-22 NOTE — ED Provider Notes (Signed)
[facility]    CSN: [id number] Arrival date & time: 11/22/22  1759      History   Chief Complaint Chief Complaint  Patient presents with   Fall    Entered by patient    HPI [name] is a 42 y.o. female.   Patient presents to urgent care for evaluation after fall at home on Tuesday November 20, 2022 at 10pm. Patient was walking from her bathroom to her bed on her hardwood floors at home when she accidentally tripped on a slick spot on the floor causing her to fall backwards and land on her buttocks and back. Her head "bounced on the floor" and when it came back up, she heard a pop to the right shoulder region. She immediately felt pain to the right shoulder after the pop. Pain to the head and shoulder have been intermittent. Pain to the shoulders responds well to heat. She did not pass out, she did not become dizzy, nauseous, or vomit after hitting her head. She was able to get up off of the floor by herself. Denies vision changes, loss of bowel/bladder control, CP, SOB, current dizziness, brain fog, or memory changes. Denies paresthesias/weakness to the extremities.  Reports neck pain to the mid neck that radiates to the right shoulder that is described as intermittent and sharp. She has never injured the neck or shoulder in the past. Taking ibuprofen 800mg  with some relief. Heat also helps pain to the neck and low back.    Fall    Past Medical History:  Diagnosis Date   Abnormal pap    cryotherapy   Anemia    GERD (gastroesophageal reflux disease)    Obesity     Patient Active Problem List   Diagnosis Date Noted   Chest discomfort 07/18/2018   Abnormal weight gain 06/23/2018   History of gestational diabetes 06/23/2018   Hx of dizziness 06/23/2018   History of anemia 06/23/2018   Abnormal uterine bleeding (AUB) 12/31/2016   Recurrent UTI 12/23/2012   Morbid obesity (HCC) 10/08/2012   Vaginal discharge 09/30/2012    Past Surgical History:  Procedure  Laterality Date   CRYOTHERAPY  2012   DILATATION & CURETTAGE/HYSTEROSCOPY WITH MYOSURE N/A 12/31/2016   Procedure: DILATATION & CURETTAGE/HYSTEROSCOPY WITH MYOSURE AT BEDSIDE NOT OPENED AND POSSIBLE RESECTION OF FIBROID;  Surgeon: Willodean Rosenthal, MD;  Location: [facility];  Service: Gynecology;  Laterality: N/A;    OB History     Gravida  3   Para  1   Term  1   Preterm      AB  1   Living  1      SAB  1   IAB      Ectopic      Multiple      Live Births               Home Medications    Prior to Admission medications   Medication Sig Start Date End Date Taking? Authorizing Provider  baclofen (LIORESAL) 10 MG tablet Take 1 tablet (10 mg total) by mouth 3 (three) times daily. 11/22/22  Yes Carlisle Beers, FNP  ibuprofen (ADVIL) 800 MG tablet Take 1 tablet (800 mg total) by mouth 3 (three) times daily. 11/22/22  Yes Carlisle Beers, FNP  loratadine (CLARITIN) 10 MG tablet Take by mouth. 11/23/19  Yes [provider]  MOUNJARO 12.5 MG/0.5ML Pen INJECT 0.5 ML (12.5 MG TOTAL) UNDER THE SKIN EVERY SEVEN DAYS. 11/22/22  Yes [provider]  ondansetron (ZOFRAN) 4 MG tablet Take 1 tablet by mouth every 8 (eight) hours as needed. 10/16/22  Yes [provider]  Accu-Chek FastClix Lancets MISC TEST TWICE A DAY BEFORE MEALS 04/13/19   Arnette Felts, FNP  ACCU-CHEK GUIDE test strip TEST TWICE A DAY BEFORE MEALS 04/06/19   Arnette Felts, FNP  blood glucose meter kit and supplies Dispense based on patient and insurance preference. Check blood sugar twice a day before meals (FOR ICD-10 E10.9, E11.9). 12/11/18   Arnette Felts, FNP  Esomeprazole Magnesium (NEXIUM 24HR) 20 MG TBEC Take 1 tablet every day by oral route.    [provider]  insulin degludec (TRESIBA FLEXTOUCH) 100 UNIT/ML SOPN FlexTouch Pen Inject 0.1 mLs (10 Units total) into the skin daily. 12/11/18   Arnette Felts, FNP  levothyroxine (SYNTHROID) 25 MCG tablet TAKE 1 TABLET (25  MCG TOTAL) BY MOUTH DAILY BEFORE BREAKFAST. 01/30/19   Arnette Felts, FNP  losartan (COZAAR) 50 MG tablet Take 50 mg by mouth daily.    [provider]  megestrol (MEGACE) 20 MG tablet Take 20 mg by mouth daily.    [provider]  metFORMIN (GLUCOPHAGE) 500 MG tablet Take 1 tablet (500 mg total) by mouth 2 (two) times daily with a meal. 12/11/18 12/11/19  Arnette Felts, FNP    Family History Family History  Problem Relation Age of Onset   Liver disease Mother 44    Social History Social History   Tobacco Use   Smoking status: Former    Current packs/day: 0.25    Average packs/day: 0.3 packs/day for 2.0 years (0.5 ttl pk-yrs)    Types: Cigarettes   Smokeless tobacco: Never  Vaping Use   Vaping status: Never Used  Substance Use Topics   Alcohol use: Yes    Comment: occassionally   Drug use: No     Allergies   Patient has no known allergies.   Review of Systems Review of Systems Per HPI  Physical Exam Triage Vital Signs ED Triage Vitals  Encounter Vitals Group     BP 11/22/22 1850 133/84     Systolic BP Percentile --      Diastolic BP Percentile --      Pulse Rate 11/22/22 1850 86     Resp 11/22/22 1850 16     Temp 11/22/22 1850 98.6 F (37 C)     Temp Source 11/22/22 1850 Oral     SpO2 11/22/22 1850 97 %     Weight --      Height --      Head Circumference --      Peak Flow --      Pain Score 11/22/22 1846 8     Pain Loc --      Pain Education --      Exclude from Growth Chart --    No data found.  Updated Vital Signs BP 133/84 (BP Location: Left Arm)   Pulse 86   Temp 98.6 F (37 C) (Oral)   Resp 16   SpO2 97%   Visual Acuity Right Eye Distance:   Left Eye Distance:   Bilateral Distance:    Right Eye Near:   Left Eye Near:    Bilateral Near:     Physical Exam Vitals and nursing note reviewed.  Constitutional:      Appearance: She is not ill-appearing or toxic-appearing.  HENT:     Head: Normocephalic and atraumatic.      Right  Ear: Hearing, tympanic membrane, ear canal and external ear normal.     Left Ear: Hearing, tympanic membrane, ear canal and external ear normal.     Nose: Nose normal.     Mouth/Throat:     Lips: Pink.     Mouth: Mucous membranes are moist. No injury.     Tongue: No lesions. Tongue does not deviate from midline.     Palate: No mass and lesions.     Pharynx: Oropharynx is clear. Uvula midline. No pharyngeal swelling, oropharyngeal exudate, posterior oropharyngeal erythema or uvula swelling.     Tonsils: No tonsillar exudate or tonsillar abscesses.  Eyes:     General: Lids are normal. Vision grossly intact. Gaze aligned appropriately. No visual field deficit.    Extraocular Movements: Extraocular movements intact.     Conjunctiva/sclera: Conjunctivae normal.  Neck:     Trachea: Trachea and phonation normal.  Cardiovascular:     Rate and Rhythm: Normal rate and regular rhythm.     Heart sounds: Normal heart sounds, S1 normal and S2 normal.  Pulmonary:     Effort: Pulmonary effort is normal. No respiratory distress.     Breath sounds: Normal breath sounds and air entry.  Musculoskeletal:     Right shoulder: Normal.     Left shoulder: Normal.     Cervical back: Normal, full passive range of motion without pain, normal range of motion and neck supple. No edema, erythema, signs of trauma, rigidity, torticollis or crepitus. Spinous process tenderness (No crepitus, stepoff. Mild TTP to the spinous processes.) and muscular tenderness (TTP to multiple points of palpation over the bilateral trapezius muscles) present. No pain with movement. Normal range of motion.     Thoracic back: Normal.     Lumbar back: Normal. No swelling, edema, deformity, signs of trauma, lacerations, spasms, tenderness or bony tenderness. Normal range of motion. Negative left straight leg raise test. No scoliosis.     Comments: Full ROM of cervical spine, bilateral shoulders.   Lymphadenopathy:     Cervical: No  cervical adenopathy.  Skin:    General: Skin is warm and dry.     Capillary Refill: Capillary refill takes less than 2 seconds.     Findings: No rash.  Neurological:     General: No focal deficit present.     Mental Status: She is alert and oriented to person, place, and time. Mental status is at baseline.     Cranial Nerves: Cranial nerves 2-12 are intact. No cranial nerve deficit, dysarthria or facial asymmetry.     Sensory: Sensation is intact.     Motor: Motor function is intact. No weakness or pronator drift.     Coordination: Coordination is intact.     Gait: Gait is intact.     Comments: Strength and sensation intact to bilateral upper and lower extremities (5/5). Moves all 4 extremities with normal coordination voluntarily. Non-focal neuro exam.   Psychiatric:        Mood and Affect: Mood normal.        Speech: Speech normal.        Behavior: Behavior normal.        Thought Content: Thought content normal.        Judgment: Judgment normal.      UC Treatments / Results  Labs (all labs ordered are listed, but only abnormal results are displayed) Labs Reviewed - No data to display  EKG   Radiology No results found.  Procedures Procedures (including critical care  time)  Medications Ordered in UC Medications - No data to display  Initial Impression / Assessment and Plan / UC Course  I have reviewed the triage vital signs and the nursing notes.  Pertinent labs & imaging results that were available during my care of the patient were reviewed by me and considered in my medical decision making (see chart for details).   1. Fall, bad headache Evaluation suggests pain is muscular in nature. Will manage this with rest, gentle ROM exercises, heat therapy, ibuprofen as needed for pain, and as needed use of muscle relaxer. Drowsiness precautions discussed regarding muscle relaxer use. Imaging: no indication for imaging based on stable musculoskeletal exam findings Stable  neurologic exam, low suspicion for intracranial abnormality. May follow-up with orthopedics as needed.  Counseled patient on potential for adverse effects with medications prescribed/recommended today, strict ER and return-to-clinic precautions discussed, patient verbalized understanding.    Final Clinical Impressions(s) / UC Diagnoses   Final diagnoses:  Fall, initial encounter  Bad headache     Discharge Instructions      Your pain is likely due to a muscle strain which will improve on its own with time.   - You may take over the counter medicines to help with pain.  - If you were given a shot of pain medicine in the clinic today, you may start taking ibuprofen/other NSAIDs in 12-24 hours. - You may also take the prescribed muscle relaxer as directed as needed for muscle aches/spasm.  Do not take this medication and drive or drink alcohol as it can make you sleepy.  Mainly use this medicine at nighttime as needed. - Apply heat 20 minutes on then 20 minutes off and perform gentle range of motion exercises to the area of greatest pain to prevent muscle stiffness and provide further pain relief.   Red flag symptoms to watch out for are numbness/tingling to the legs, weakness, loss of bowel/bladder control, and/or worsening pain that does not respond well to medicines. Follow-up with your primary care provider or return to urgent care if your symptoms do not improve in the next 3 to 4 days with medications and interventions recommended today. If your symptoms are severe (red flag), please go to the emergency room.        ED Prescriptions     Medication Sig Dispense Auth. Provider   baclofen (LIORESAL) 10 MG tablet Take 1 tablet (10 mg total) by mouth 3 (three) times daily. 30 each Carlisle Beers, FNP   ibuprofen (ADVIL) 800 MG tablet Take 1 tablet (800 mg total) by mouth 3 (three) times daily. 21 tablet Carlisle Beers, FNP      PDMP not reviewed this encounter.    Carlisle Beers, [place] 11/26/22 2211

## 2022-11-22 NOTE — ED Triage Notes (Signed)
Pt states she slipped/fell 8/6 ~10pm-hit back of head and back on the floor-"heard and felt a pop" to right later neck/shoulder-pain at site and "all to my right side and my tailbone"-also c/o numbness to right toes and a HA-NAD-steady gait
